# Patient Record
Sex: Female | Born: 1970 | Race: White | Hispanic: No | Marital: Married | Smoking: Never smoker
Health system: Southern US, Community
[De-identification: ages and names within clinical notes are randomized; demographics above are authoritative.]

## PROBLEM LIST (undated history)

## (undated) DIAGNOSIS — B019 Varicella without complication: Secondary | ICD-10-CM

## (undated) DIAGNOSIS — C4491 Basal cell carcinoma of skin, unspecified: Secondary | ICD-10-CM

## (undated) DIAGNOSIS — K219 Gastro-esophageal reflux disease without esophagitis: Secondary | ICD-10-CM

## (undated) DIAGNOSIS — N840 Polyp of corpus uteri: Secondary | ICD-10-CM

## (undated) DIAGNOSIS — H8109 Meniere's disease, unspecified ear: Secondary | ICD-10-CM

## (undated) DIAGNOSIS — N390 Urinary tract infection, site not specified: Secondary | ICD-10-CM

## (undated) HISTORY — DX: Urinary tract infection, site not specified: N39.0

## (undated) HISTORY — DX: Meniere's disease, unspecified ear: H81.09

## (undated) HISTORY — DX: Varicella without complication: B01.9

## (undated) HISTORY — DX: Polyp of corpus uteri: N84.0

## (undated) HISTORY — DX: Gastro-esophageal reflux disease without esophagitis: K21.9

## (undated) HISTORY — PX: DILATION AND CURETTAGE OF UTERUS: SHX78

## (undated) HISTORY — DX: Basal cell carcinoma of skin, unspecified: C44.91

---

## 2010-06-20 ENCOUNTER — Emergency Department: Payer: Self-pay | Admitting: Emergency Medicine

## 2011-01-28 ENCOUNTER — Ambulatory Visit: Payer: Self-pay

## 2011-05-03 ENCOUNTER — Ambulatory Visit: Payer: Self-pay | Admitting: Internal Medicine

## 2012-10-04 ENCOUNTER — Ambulatory Visit: Payer: Self-pay

## 2012-10-14 ENCOUNTER — Ambulatory Visit: Payer: Self-pay | Admitting: Family Medicine

## 2014-03-21 ENCOUNTER — Ambulatory Visit: Payer: Self-pay | Admitting: Nurse Practitioner

## 2014-03-26 ENCOUNTER — Ambulatory Visit: Payer: Self-pay | Admitting: Otolaryngology

## 2014-03-30 ENCOUNTER — Ambulatory Visit: Payer: Self-pay | Admitting: Nurse Practitioner

## 2014-04-06 ENCOUNTER — Ambulatory Visit (INDEPENDENT_AMBULATORY_CARE_PROVIDER_SITE_OTHER): Payer: BLUE CROSS/BLUE SHIELD | Admitting: Nurse Practitioner

## 2014-04-06 ENCOUNTER — Encounter: Payer: Self-pay | Admitting: Nurse Practitioner

## 2014-04-06 ENCOUNTER — Encounter (INDEPENDENT_AMBULATORY_CARE_PROVIDER_SITE_OTHER): Payer: Self-pay

## 2014-04-06 VITALS — BP 106/70 | HR 79 | Temp 98.4°F | Resp 12 | Ht 64.5 in | Wt 198.0 lb

## 2014-04-06 DIAGNOSIS — Z7189 Other specified counseling: Secondary | ICD-10-CM

## 2014-04-06 DIAGNOSIS — Z1239 Encounter for other screening for malignant neoplasm of breast: Secondary | ICD-10-CM

## 2014-04-06 DIAGNOSIS — Z7689 Persons encountering health services in other specified circumstances: Secondary | ICD-10-CM

## 2014-04-06 NOTE — Progress Notes (Signed)
Pre visit review using our clinic review tool, if applicable. No additional management support is needed unless otherwise documented below in the visit note. 

## 2014-04-06 NOTE — Patient Instructions (Signed)
We will contact you about your referral for a mammogram.   Call us if you need [place] in the meantime.   Welcome to [facility]!

## 2014-04-06 NOTE — Progress Notes (Signed)
Subjective:    Patient ID: Holly Davenport, female    DOB: 18-Mar-1970, 44 y.o.   MRN: [id number]  HPI  Holly Davenport is a 44 yo female here to establish care.   1) Health Maintenance-   Diet- Cooks at home, eats out 2 x a week   Exercise- 2 x week, bike, 30 min.   Immunizations- Refused flu  Mammogram- normal in past  Pap- due 2017  Bone Density- Not of age  Colonoscopy- Not of age  [place] Exam- 2015 summer   Dental Exam- Up to date  2) Chronic Problems-  ENT Dr. Pryor Ochoa- Meniere's disease    [place] of thyroid- small nodule not suitable for biopsy at this. Repeat in 6 months.   3) Acute Problems-  None, denies needing lab work- labs checked by another doctor's office recently.    Review of Systems  Constitutional: Negative for fever, chills, diaphoresis, fatigue and unexpected weight change.  HENT: Negative for tinnitus and trouble swallowing.   Eyes: Negative for visual disturbance.       Has Nystagmus- since she was young.   Respiratory: Negative for chest tightness, shortness of breath and wheezing.   Cardiovascular: Negative for chest pain, palpitations and leg swelling.  Gastrointestinal: Negative for nausea, vomiting, abdominal pain, diarrhea, constipation and blood in stool.  Endocrine: Negative for polydipsia, polyphagia and polyuria.  Genitourinary: Negative for dysuria, hematuria, vaginal discharge and vaginal pain.  Musculoskeletal: Negative for myalgias, back pain, arthralgias and gait problem.  Skin: Negative for color change and rash.       Dry skin patches worse in winter.   Neurological: Negative for dizziness, weakness, numbness and headaches.  Hematological: Does not bruise/bleed easily.  Psychiatric/Behavioral: Negative for suicidal ideas and sleep disturbance. The patient is not nervous/anxious.    Past Medical History  Diagnosis Date  . Chicken pox   . UTI (lower urinary tract infection)     Hx of UTI  . GERD (gastroesophageal reflux disease)      History   Social History  . Marital Status: Married    Spouse Name: N/A    Number of Children: N/A  . Years of Education: N/A   Occupational History  . Not on file.   Social History Main Topics  . Smoking status: Never Smoker   . Smokeless tobacco: Never Used  . Alcohol Use: No  . Drug Use: No  . Sexual Activity:    Partners: Male   Other Topics Concern  . 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 No pertinent past surgical history.  Family History  Problem Relation Age of Onset  . Stroke Mother   . Diabetes Father   . Heart disease Father     Heart attack    No Known Allergies  No current outpatient prescriptions on file prior to visit.   No current facility-administered medications on file prior to visit.      Objective:   Physical Exam  Constitutional: She is oriented to person, place, and time. She appears well-developed and well-nourished. No distress.  BP 106/70 mmHg  Pulse 79  Temp(Src) 98.4 F (36.9 C) (Oral)  Resp 12  Ht 5' 4.5" (  1.638 m)  Wt 198 lb (89.812 kg)  BMI 33.47 kg/m2  SpO2 95%  LMP  (Approximate)   HENT:  Head: Normocephalic and atraumatic.  Right Ear: External ear normal.  Left Ear: External ear normal.  Cardiovascular: Normal rate, regular rhythm, normal heart sounds and intact distal pulses.  Exam reveals no gallop and no friction rub.   No murmur heard. Pulmonary/Chest: Effort normal and breath sounds normal. No respiratory distress. She has no wheezes. She has no rales. She exhibits no tenderness.  Neurological: She is alert and oriented to person, place, and time. No cranial nerve deficit. She exhibits normal muscle tone. Coordination normal.  Skin: Skin is warm and dry. No rash noted. She  is not diaphoretic.  Psychiatric: She has a normal mood and affect. Her behavior is normal. Judgment and thought content normal.      Assessment & Plan:

## 2014-04-08 DIAGNOSIS — Z1239 Encounter for other screening for malignant neoplasm of breast: Secondary | ICD-10-CM | POA: Insufficient documentation

## 2014-04-08 NOTE — Assessment & Plan Note (Signed)
Ordered referral for Mammogram. Pt seen by another office and is establishing care here since it is closer. Reviewed were health maintenance, PFSHx, and immunizations. Labs not needed and referrals were placed. FU prn.

## 2014-07-20 ENCOUNTER — Encounter: Payer: BLUE CROSS/BLUE SHIELD | Admitting: Nurse Practitioner

## 2014-08-10 ENCOUNTER — Encounter: Payer: BLUE CROSS/BLUE SHIELD | Admitting: Nurse Practitioner

## 2014-09-25 ENCOUNTER — Other Ambulatory Visit: Payer: Self-pay | Admitting: Otolaryngology

## 2014-09-25 DIAGNOSIS — E041 Nontoxic single thyroid nodule: Secondary | ICD-10-CM

## 2014-12-11 ENCOUNTER — Ambulatory Visit (INDEPENDENT_AMBULATORY_CARE_PROVIDER_SITE_OTHER): Payer: 59 | Admitting: Nurse Practitioner

## 2014-12-11 VITALS — BP 102/80 | HR 75 | Temp 98.1°F | Resp 14 | Ht 64.5 in | Wt 196.4 lb

## 2014-12-11 DIAGNOSIS — R300 Dysuria: Secondary | ICD-10-CM

## 2014-12-11 DIAGNOSIS — Z7251 High risk heterosexual behavior: Secondary | ICD-10-CM

## 2014-12-11 LAB — POCT URINE PREGNANCY: PREG TEST UR: NEGATIVE

## 2014-12-11 LAB — POCT URINALYSIS DIPSTICK
BILIRUBIN UA: NEGATIVE
Glucose, UA: NEGATIVE
Ketones, UA: NEGATIVE
Nitrite, UA: NEGATIVE
Protein, UA: NEGATIVE
Spec Grav, UA: 1.01
Urobilinogen, UA: 0.2
pH, UA: 5.5

## 2014-12-11 MED ORDER — FLUCONAZOLE 150 MG PO TABS: 150.0000 mg | ORAL_TABLET | Freq: Once | ORAL | Status: DC

## 2014-12-11 NOTE — Progress Notes (Signed)
Patient ID: Holly Davenport, female    DOB: [birth year]  Age: 44 y.o. MRN: [id number]  CC: Urinary Tract Infection   HPI Holly Davenport presents for Dysuria x 4 days.  1) Pt describes lower abdominal pain, low back pain, slight nausea, and burning/itching since the weekend.  Last week spotting. Has had unprotected sex with husband and she was hoping it was not pregnancy with the nausea. Burning and itching without discharge or odor.   History Kachina has a past medical history of Chicken pox; UTI (lower urinary tract infection); and GERD (gastroesophageal reflux disease).   She has no past surgical history on file.   Her family history includes Diabetes in her father; Heart disease in her father; Stroke in her mother.She reports that she has never smoked. She has never used smokeless tobacco. She reports that she does not drink alcohol or use illicit drugs.  Outpatient Prescriptions Prior to Visit  Medication Sig Dispense Refill  . triamterene-hydrochlorothiazide (DYAZIDE) 37.5-25 MG per capsule Take 1 capsule by mouth daily.     No facility-administered medications prior to visit.    ROS Review of Systems  Constitutional: Negative for fever, chills, diaphoresis and fatigue.  Respiratory: Negative for chest tightness, shortness of breath and wheezing.   Cardiovascular: Negative for chest pain, palpitations and leg swelling.  Gastrointestinal: Positive for abdominal pain. Negative for nausea, vomiting, diarrhea and abdominal distention.       Lower  Genitourinary: Positive for dysuria and vaginal bleeding. Negative for urgency, frequency and difficulty urinating.       Low back pain    Objective:  BP 102/80 mmHg  Pulse 75  Temp(Src) 98.1 F (36.7 C)  Resp 14  Ht 5' 4.5" (1.638 m)  Wt 196 lb 6.4 oz (89.086 kg)  BMI 33.20 kg/m2  SpO2 97%  Physical Exam  Constitutional: She is oriented to person, place, and time. She appears well-developed and well-nourished. No distress.   HENT:  Head: Normocephalic and atraumatic.  Right Ear: External ear normal.  Left Ear: External ear normal.  Cardiovascular: Normal rate, regular rhythm and normal heart sounds.  Exam reveals no gallop and no friction rub.   No murmur heard. Pulmonary/Chest: Effort normal and breath sounds normal. No respiratory distress. She has no wheezes. She has no rales. She exhibits no tenderness.  Neurological: She is alert and oriented to person, place, and time. No cranial nerve deficit. She exhibits normal muscle tone. Coordination normal.  Skin: Skin is warm and dry. No rash noted. She is not diaphoretic.  Psychiatric: She has a normal mood and affect. Her behavior is normal. Judgment and thought content normal.   Assessment & Plan:   Shatara was seen today for urinary tract infection.  Diagnoses and all orders for this visit:  Dysuria -     POCT Urinalysis Dipstick -     Urine Culture  Unprotected sex -     POCT urine pregnancy  Other orders -     fluconazole (DIFLUCAN) 150 MG tablet; Take 1 tablet (150 mg total) by mouth once.   I am having Ms. Sevey start on fluconazole. I am also having her maintain her triamterene-hydrochlorothiazide.  Meds ordered this encounter  Medications  . fluconazole (DIFLUCAN) 150 MG tablet    Sig: Take 1 tablet (150 mg total) by mouth once.    Dispense:  1 tablet    Refill:  0    Order Specific Question:  Supervising Provider    Answer:  Derrel Nip,  TERESA L [[id number]]     Follow-up: Return if symptoms worsen or fail to improve.

## 2014-12-11 NOTE — Patient Instructions (Signed)
Rest, water, unsweetened cranberry juice- optional Diflucan take 1  We will call with culture results.

## 2014-12-11 NOTE — Progress Notes (Signed)
Pre visit review using our clinic review tool, if applicable. No additional management support is needed unless otherwise documented below in the visit note. 

## 2014-12-12 ENCOUNTER — Encounter: Payer: Self-pay | Admitting: Nurse Practitioner

## 2014-12-12 DIAGNOSIS — R300 Dysuria: Secondary | ICD-10-CM | POA: Insufficient documentation

## 2014-12-12 DIAGNOSIS — Z7251 High risk heterosexual behavior: Secondary | ICD-10-CM | POA: Insufficient documentation

## 2014-12-12 NOTE — Assessment & Plan Note (Signed)
New onset dysuria symptoms. POCT urine was not clear about infection. Will get urine culture. Will treat pt for possible yeast infection with diflucan, but due to recent unprotected sexual intercourse will obtain POCT urine pregnancy before treating

## 2014-12-12 NOTE — Assessment & Plan Note (Signed)
Recent unprotected sex with husband last menstrual period. Will obtain POCT urine pregnancy just to be on the safe side before treatment.

## 2014-12-13 ENCOUNTER — Other Ambulatory Visit: Payer: Self-pay | Admitting: Nurse Practitioner

## 2014-12-13 LAB — URINE CULTURE: Colony Count: >=100000

## 2014-12-13 MED ORDER — FLUCONAZOLE 200 MG PO TABS: 200.0000 mg | ORAL_TABLET | Freq: Every day | ORAL | Status: DC

## 2015-01-06 ENCOUNTER — Encounter: Payer: Self-pay | Admitting: Gynecology

## 2015-01-06 ENCOUNTER — Ambulatory Visit: Payer: 59

## 2015-01-06 ENCOUNTER — Ambulatory Visit
Admission: EM | Admit: 2015-01-06 | Discharge: 2015-01-06 | Disposition: A | Discharge status: Home / Self Care | Payer: 59 | Attending: Family Medicine | Admitting: Family Medicine

## 2015-01-06 DIAGNOSIS — H6593 Unspecified nonsuppurative otitis media, bilateral: Secondary | ICD-10-CM | POA: Diagnosis not present

## 2015-01-06 DIAGNOSIS — J209 Acute bronchitis, unspecified: Secondary | ICD-10-CM | POA: Diagnosis not present

## 2015-01-06 DIAGNOSIS — J0100 Acute maxillary sinusitis, unspecified: Secondary | ICD-10-CM

## 2015-01-06 DIAGNOSIS — J040 Acute laryngitis: Secondary | ICD-10-CM

## 2015-01-06 DIAGNOSIS — J029 Acute pharyngitis, unspecified: Secondary | ICD-10-CM | POA: Diagnosis not present

## 2015-01-06 LAB — RAPID STREP SCREEN (MED CTR MEBANE ONLY): STREPTOCOCCUS, GROUP A SCREEN (DIRECT): NEGATIVE

## 2015-01-06 MED ORDER — DESLORATADINE 5 MG PO TABS: 5.0000 mg | ORAL_TABLET | Freq: Every day | ORAL | Status: DC

## 2015-01-06 MED ORDER — PREDNISONE 20 MG PO TABS: 40.0000 mg | ORAL_TABLET | Freq: Every day | ORAL | Status: DC

## 2015-01-06 MED ORDER — SALINE SPRAY 0.65 % NA SOLN: 2.0000 | NASAL | Status: DC

## 2015-01-06 MED ORDER — FLUTICASONE PROPIONATE 50 MCG/ACT NA SUSP: 1.0000 | Freq: Two times a day (BID) | NASAL | Status: DC

## 2015-01-06 MED ORDER — IPRATROPIUM-ALBUTEROL 0.5-2.5 (3) MG/3ML IN SOLN
3.0000 mL | Freq: Four times a day (QID) | RESPIRATORY_TRACT | Status: DC
  Administered 2015-01-06: 3 mL via RESPIRATORY_TRACT

## 2015-01-06 MED ORDER — ALBUTEROL SULFATE HFA 108 (90 BASE) MCG/ACT IN AERS: 1.0000 | INHALATION_SPRAY | RESPIRATORY_TRACT | Status: DC | PRN

## 2015-01-06 MED ORDER — AMOXICILLIN-POT CLAVULANATE 875-125 MG PO TABS: 1.0000 | ORAL_TABLET | Freq: Two times a day (BID) | ORAL | Status: DC

## 2015-01-06 MED ORDER — BENZONATATE 200 MG PO CAPS: 200.0000 mg | ORAL_CAPSULE | Freq: Three times a day (TID) | ORAL | Status: DC | PRN

## 2015-01-06 NOTE — Discharge Instructions (Signed)
Acute Bronchitis Bronchitis is inflammation of the airways that extend from the windpipe into the lungs (bronchi). The inflammation often causes mucus to develop. This leads to a cough, which is the most common symptom of bronchitis.  In acute bronchitis, the condition usually develops suddenly and goes away over time, usually in a couple weeks. Smoking, allergies, and asthma can make bronchitis worse. Repeated episodes of bronchitis may cause further lung problems.  CAUSES Acute bronchitis is most often caused by the same virus that causes a cold. The virus can spread from person to person (contagious) through coughing, sneezing, and touching contaminated objects. SIGNS AND SYMPTOMS   Cough.   Fever.   Coughing up mucus.   Body aches.   Chest congestion.   Chills.   Shortness of breath.   Sore throat.  DIAGNOSIS  Acute bronchitis is usually diagnosed through a physical exam. Your health care provider will also ask you questions about your medical history. Tests, such as chest X-rays, are sometimes done to rule out other conditions.  TREATMENT  Acute bronchitis usually goes away in a couple weeks. Oftentimes, no medical treatment is necessary. Medicines are sometimes given for relief of fever or cough. Antibiotic medicines are usually not needed but may be prescribed in certain situations. In some cases, an inhaler may be recommended to help reduce shortness of breath and control the cough. A cool mist vaporizer may also be used to help thin bronchial secretions and make it easier to clear the chest.  HOME CARE INSTRUCTIONS  Get plenty of rest.   Drink enough fluids to keep your urine clear or pale yellow (unless you have a medical condition that requires fluid restriction). Increasing fluids may help thin your respiratory secretions (sputum) and reduce chest congestion, and it will prevent dehydration.   Take medicines only as directed by your health care provider.  If  you were prescribed an antibiotic medicine, finish it all even if you start to feel better.  Avoid smoking and secondhand smoke. Exposure to cigarette smoke or irritating chemicals will make bronchitis worse. If you are a smoker, consider using nicotine gum or skin patches to help control withdrawal symptoms. Quitting smoking will help your lungs heal faster.   Reduce the chances of another bout of acute bronchitis by washing your hands frequently, avoiding people with cold symptoms, and trying not to touch your hands to your mouth, nose, or eyes.   Keep all follow-up visits as directed by your health care provider.  SEEK MEDICAL CARE IF: Your symptoms do not improve after 1 week of treatment.  SEEK IMMEDIATE MEDICAL CARE IF:  You develop an increased fever or chills.   You have chest pain.   You have severe shortness of breath.  You have bloody sputum.   You develop dehydration.  You faint or repeatedly feel like you are going to pass out.  You develop repeated vomiting.  You develop a severe headache. MAKE SURE YOU:   Understand these instructions.  Will watch your condition.  Will get help right away if you are not doing well or get worse.   This information is not intended to replace advice given to you by your health care provider. Make sure you discuss any questions you have with your health care provider.   Document Released: 04/02/2004 Document Revised: 03/16/2014 Document Reviewed: 08/16/2012 Elsevier Interactive Patient Education 2016 [employer] Pharyngitis Pharyngitis is redness, pain, and swelling (inflammation) of your pharynx.  CAUSES  Pharyngitis is usually caused by  infection. Most of the time, these infections are from viruses (viral) and are part of a cold. However, sometimes pharyngitis is caused by bacteria (bacterial). Pharyngitis can also be caused by allergies. Viral pharyngitis may be spread from person to person by coughing, sneezing, and  personal items or utensils (cups, forks, spoons, toothbrushes). Bacterial pharyngitis may be spread from person to person by more intimate contact, such as kissing.  SIGNS AND SYMPTOMS  Symptoms of pharyngitis include:   Sore throat.   Tiredness (fatigue).   Low-grade fever.   Headache.  Joint pain and muscle aches.  Skin rashes.  Swollen lymph nodes.  Plaque-like film on throat or tonsils (often seen with bacterial pharyngitis). DIAGNOSIS  Your health care provider will ask you questions about your illness and your symptoms. Your medical history, along with a physical exam, is often all that is needed to diagnose pharyngitis. Sometimes, a rapid strep test is done. Other lab tests may also be done, depending on the suspected cause.  TREATMENT  Viral pharyngitis will usually get better in 3-4 days without the use of medicine. Bacterial pharyngitis is treated with medicines that kill germs (antibiotics).  HOME CARE INSTRUCTIONS   Drink enough water and fluids to keep your urine clear or pale yellow.   Only take over-the-counter or prescription medicines as directed by your health care provider:   If you are prescribed antibiotics, make sure you finish them even if you start to feel better.   Do not take aspirin.   Get lots of rest.   Gargle with 8 oz of salt water ( tsp of salt per 1 qt of water) as often as every 1-2 hours to soothe your throat.   Throat lozenges (if you are not at risk for choking) or sprays may be used to soothe your throat. SEEK MEDICAL CARE IF:   You have large, tender lumps in your neck.  You have a rash.  You cough up green, yellow-brown, or bloody spit. SEEK IMMEDIATE MEDICAL CARE IF:   Your neck becomes stiff.  You drool or are unable to swallow liquids.  You vomit or are unable to keep medicines or liquids down.  You have severe pain that does not go away with the use of recommended medicines.  You have trouble breathing (not  caused by a stuffy nose). MAKE SURE YOU:   Understand these instructions.  Will watch your condition.  Will get help right away if you are not doing well or get worse.   This information is not intended to replace advice given to you by your health care provider. Make sure you discuss any questions you have with your health care provider.   Document Released: 02/23/2005 Document Revised: 12/14/2012 Document Reviewed: 10/31/2012 Elsevier Interactive Patient Education 2016 [employer] Sinusitis, Adult Sinusitis is redness, soreness, and inflammation of the paranasal sinuses. Paranasal sinuses are air pockets within the bones of your face. They are located beneath your eyes, in the middle of your forehead, and above your eyes. In healthy paranasal sinuses, mucus is able to drain out, and air is able to circulate through them by way of your nose. However, when your paranasal sinuses are inflamed, mucus and air can become trapped. This can allow bacteria and other germs to grow and cause infection. Sinusitis can develop quickly and last only a short time (acute) or continue over a long period (chronic). Sinusitis that lasts for more than 12 weeks is considered chronic. CAUSES Causes of sinusitis include:  Allergies.  Structural abnormalities, such as displacement of the cartilage that separates your nostrils (deviated septum), which can decrease the air flow through your nose and sinuses and affect sinus drainage.  Functional abnormalities, such as when the small hairs (cilia) that line your sinuses and help remove mucus do not work properly or are not present. SIGNS AND SYMPTOMS Symptoms of acute and chronic sinusitis are the same. The primary symptoms are pain and pressure around the affected sinuses. Other symptoms include:  Upper toothache.  Earache.  Headache.  Bad breath.  Decreased sense of smell and taste.  A cough, which worsens when you are lying  flat.  Fatigue.  Fever.  Thick drainage from your nose, which often is green and may contain pus (purulent).  Swelling and warmth over the affected sinuses. DIAGNOSIS Your health care provider will perform a physical exam. During your exam, your health care provider may perform any of the following to help determine if you have acute sinusitis or chronic sinusitis:  Look in your nose for signs of abnormal growths in your nostrils (nasal polyps).  Tap over the affected sinus to check for signs of infection.  View the inside of your sinuses using an imaging device that has a light attached (endoscope). If your health care provider suspects that you have chronic sinusitis, one or more of the following tests may be recommended:  Allergy tests.  Nasal culture. A sample of mucus is taken from your nose, sent to a lab, and screened for bacteria.  Nasal cytology. A sample of mucus is taken from your nose and examined by your health care provider to determine if your sinusitis is related to an allergy. TREATMENT Most cases of acute sinusitis are related to a viral infection and will resolve on their own within 10 days. Sometimes, medicines are prescribed to help relieve symptoms of both acute and chronic sinusitis. These may include pain medicines, decongestants, nasal steroid sprays, or saline sprays. However, for sinusitis related to a bacterial infection, your health care provider will prescribe antibiotic medicines. These are medicines that will help kill the bacteria causing the infection. Rarely, sinusitis is caused by a fungal infection. In these cases, your health care provider will prescribe antifungal medicine. For some cases of chronic sinusitis, surgery is needed. Generally, these are cases in which sinusitis recurs more than 3 times per year, despite other treatments. HOME CARE INSTRUCTIONS  Drink plenty of water. Water helps thin the mucus so your sinuses can drain more  easily.  Use a humidifier.  Inhale steam 3-4 times a day (for example, sit in the bathroom with the shower running).  Apply a warm, moist washcloth to your face 3-4 times a day, or as directed by your health care provider.  Use saline nasal sprays to help moisten and clean your sinuses.  Take medicines only as directed by your health care provider.  If you were prescribed either an antibiotic or antifungal medicine, finish it all even if you start to feel better. SEEK IMMEDIATE MEDICAL CARE IF:  You have increasing pain or severe headaches.  You have nausea, vomiting, or drowsiness.  You have swelling around your face.  You have vision problems.  You have a stiff neck.  You have difficulty breathing.   This information is not intended to replace advice given to you by your health care provider. Make sure you discuss any questions you have with your health care provider.   Document Released: 02/23/2005 Document Revised: 03/16/2014 Document Reviewed: 03/10/2011 Elsevier  Interactive Patient Education 2016 [place]. Otitis Media With Effusion Otitis media with effusion is the presence of fluid in the middle ear. This is a common problem in children, which often follows ear infections. It may be present for weeks or longer after the infection. Unlike an acute ear infection, otitis media with effusion refers only to fluid behind the ear drum and not infection. Children with repeated ear and sinus infections and allergy problems are the most likely to get otitis media with effusion. CAUSES  The most frequent cause of the fluid buildup is dysfunction of the eustachian tubes. These are the tubes that drain fluid in the ears to the back of the nose (nasopharynx). SYMPTOMS   The main symptom of this condition is hearing loss. As a result, you or your child may:  Listen to the TV at a loud volume.  Not respond to questions.  Ask "what" often when spoken to.  Mistake or confuse  one sound or word for another.  There may be a sensation of fullness or pressure but usually not pain. DIAGNOSIS   Your health care provider will diagnose this condition by examining you or your child's ears.  Your health care provider may test the pressure in you or your child's ear with a tympanometer.  A hearing test may be conducted if the problem persists. TREATMENT   Treatment depends on the duration and the effects of the effusion.  Antibiotics, decongestants, nose drops, and cortisone-type drugs (tablets or nasal spray) may not be helpful.  Children with persistent ear effusions may have delayed language or behavioral problems. Children at risk for developmental delays in hearing, learning, and speech may require referral to a specialist earlier than children not at risk.  You or your child's health care provider may suggest a referral to an ear, nose, and throat surgeon for treatment. The following may help restore normal hearing:  Drainage of fluid.  Placement of ear tubes (tympanostomy tubes).  Removal of adenoids (adenoidectomy). HOME CARE INSTRUCTIONS   Avoid secondhand smoke.  Infants who are breastfed are less likely to have this condition.  Avoid feeding infants while they are lying flat.  Avoid known environmental allergens.  Avoid people who are sick. SEEK MEDICAL CARE IF:   Hearing is not better in 3 months.  Hearing is worse.  Ear pain.  Drainage from the ear.  Dizziness. MAKE SURE YOU:   Understand these instructions.  Will watch your condition.  Will get help right away if you are not doing well or get worse.   This information is not intended to replace advice given to you by your health care provider. Make sure you discuss any questions you have with your health care provider.   Document Released: 04/02/2004 Document Revised: 03/16/2014 Document Reviewed: 09/20/2012 Elsevier Interactive Patient Education 2016 [employer] Laryngitis Laryngitis is inflammation of your vocal cords. This causes hoarseness, coughing, loss of voice, sore throat, or a dry throat. Your vocal cords are two bands of muscles that are found in your throat. When you speak, these cords come together and vibrate. These vibrations come out through your mouth as sound. When your vocal cords are inflamed, your voice sounds different. Laryngitis can be temporary (acute) or long-term (chronic). Most cases of acute laryngitis improve with time. Chronic laryngitis is laryngitis that lasts for more than three weeks. CAUSES Acute laryngitis may be caused by:  A viral infection.  Lots of talking, yelling, or singing. This is also called vocal  strain.  Bacterial infections. Chronic laryngitis may be caused by:  Vocal strain.  Injury to your vocal cords.  Acid reflux (gastroesophageal reflux disease or GERD).  Allergies.  Sinus infection.  Smoking.  Alcohol abuse.  Breathing in chemicals or dust.  Growths on the vocal cords. RISK FACTORS Risk factors for laryngitis include:  Smoking.  Alcohol abuse.  Having allergies. SIGNS AND SYMPTOMS Symptoms of laryngitis may include:  Low, hoarse voice.  Loss of voice.  Dry cough.  Sore throat.  Stuffy nose. DIAGNOSIS Laryngitis may be diagnosed by:  Physical exam.  Throat culture.  Blood test.  Laryngoscopy. This procedure allows your health care provider to look at your vocal cords with a mirror or viewing tube. TREATMENT Treatment for laryngitis depends on what is causing it. Usually, treatment involves resting your voice and using medicines to soothe your throat. However, if your laryngitis is caused by a bacterial infection, you may need to take antibiotic medicine. If your laryngitis is caused by a growth, you may need to have a procedure to remove it. HOME CARE INSTRUCTIONS  Drink enough fluid to keep your urine clear or pale yellow.  Breathe in moist air. Use  a humidifier if you live in a dry climate.  Take medicines only as directed by your health care provider.  If you were prescribed an antibiotic medicine, finish it all even if you start to feel better.  Do not smoke cigarettes or electronic cigarettes. If you need help quitting, ask your health care provider.  Talk as little as possible. Also avoid whispering, which can cause vocal strain.  Write instead of talking. Do this until your voice is back to normal. SEEK MEDICAL CARE IF:  You have a fever.  You have increasing pain.  You have difficulty swallowing. SEEK IMMEDIATE MEDICAL CARE IF:  You cough up blood.  You have trouble breathing.   This information is not intended to replace advice given to you by your health care provider. Make sure you discuss any questions you have with your health care provider.   Document Released: 02/23/2005 Document Revised: 03/16/2014 Document Reviewed: 08/08/2013 Elsevier Interactive Patient Education [employer].

## 2015-01-06 NOTE — ED Notes (Signed)
Patient c/o coughing / fever at home x 4 days of 101 and lost her voice.

## 2015-01-06 NOTE — ED Provider Notes (Addendum)
CSN: [id number]     Arrival date & time 01/06/15  1227 History   First MD Initiated Contact with Patient 01/06/15 1442     Chief Complaint  Patient presents with  . Cough  . Fever  . Sore Throat   (Consider location/radiation/quality/duration/timing/severity/associated sxs/prior Treatment) HPI Comments: Married caucasian female here for loss of nasal congestion, chest congestion, voice, fever, cough, sore throat tried motrin, sudafed, cough medicine, mucinex D, coricidin without relief of symptoms  +nausea nonproductive cough, chest tight but denied wheezing started 02 Jan 2015 child with similar symptoms has used flonase in past but not now   Patient is a 44 y.o. female presenting with cough, fever, and pharyngitis. The history is provided by the patient.  Cough Associated symptoms: fever   Associated symptoms: no chest pain, no chills, no diaphoresis, no ear pain, no eye discharge, no headaches, no myalgias, no rash, no rhinorrhea, no shortness of breath, no sore throat and no wheezing   Fever Associated symptoms: cough and nausea   Associated symptoms: no chest pain, no chills, no confusion, no congestion, no diarrhea, no dysuria, no ear pain, no headaches, no myalgias, no rash, no rhinorrhea, no sore throat and no vomiting   Sore Throat Pertinent negatives include no chest pain, no abdominal pain, no headaches and no shortness of breath.    Past Medical History  Diagnosis Date  . Chicken pox   . UTI (lower urinary tract infection)     Hx of UTI  . GERD (gastroesophageal reflux disease)    History reviewed. No pertinent past surgical history. Family History  Problem Relation Age of Onset  . Stroke Mother   . Diabetes Father   . Heart disease Father     Heart attack   Social History  Substance Use Topics  . Smoking status: Never Smoker   . Smokeless tobacco: Never Used  . Alcohol Use: No   OB History    No data available     Review of Systems  Constitutional:  Positive for fever. Negative for chills, diaphoresis, activity change, appetite change and fatigue.  HENT: Positive for postnasal drip and voice change. Negative for congestion, dental problem, drooling, ear discharge, ear pain, facial swelling, hearing loss, mouth sores, nosebleeds, rhinorrhea, sinus pressure, sneezing, sore throat, tinnitus and trouble swallowing.   Eyes: Negative for photophobia, pain, discharge, redness, itching and visual disturbance.  Respiratory: Positive for cough and chest tightness. Negative for choking, shortness of breath, wheezing and stridor.   Cardiovascular: Negative for chest pain and leg swelling.  Gastrointestinal: Positive for nausea. Negative for vomiting, abdominal pain, diarrhea, constipation, blood in stool and abdominal distention.  Endocrine: Negative for cold intolerance and heat intolerance.  Genitourinary: Negative for dysuria, hematuria and difficulty urinating.  Musculoskeletal: Negative for myalgias, back pain, joint swelling, arthralgias, gait problem, neck pain and neck stiffness.  Skin: Negative for color change, pallor, rash and wound.  Allergic/Immunologic: Positive for environmental allergies. Negative for food allergies.  Neurological: Negative for dizziness, tremors, seizures, syncope, facial asymmetry, speech difficulty, weakness, light-headedness, numbness and headaches.  Hematological: Negative for adenopathy. Does not bruise/bleed easily.  Psychiatric/Behavioral: Negative for behavioral problems, confusion and agitation. The patient is not nervous/anxious.     Allergies  Review of patient's allergies indicates no known allergies.  Home Medications   Prior to Admission medications   Medication Sig Start Date End Date Taking? Authorizing Provider  triamterene-hydrochlorothiazide (DYAZIDE) 37.5-25 MG per capsule Take 1 capsule by mouth daily.   Yes Historical  Provider, MD  albuterol (PROVENTIL HFA;VENTOLIN HFA) 108 (90 BASE) MCG/ACT  inhaler Inhale 1-2 puffs into the lungs every 4 (four) hours as needed for wheezing or shortness of breath. 01/06/15   Olen Cordial, NP  amoxicillin-clavulanate (AUGMENTIN) 875-125 MG tablet Take 1 tablet by mouth every 12 (twelve) hours. 01/06/15   Olen Cordial, NP  benzonatate (TESSALON) 200 MG capsule Take 1 capsule (200 mg total) by mouth 3 (three) times daily as needed for cough. 01/06/15   Olen Cordial, NP  desloratadine (CLARINEX) 5 MG tablet Take 1 tablet (5 mg total) by mouth daily. 01/06/15   Olen Cordial, NP  fluticasone (FLONASE) 50 MCG/ACT nasal spray Place 1 spray into both nostrils 2 (two) times daily. 01/06/15   Olen Cordial, NP  predniSONE (DELTASONE) 20 MG tablet Take 2 tablets (40 mg total) by mouth daily with breakfast. 01/06/15   Olen Cordial, NP  sodium chloride (OCEAN) 0.65 % SOLN nasal spray Place 2 sprays into both nostrils every 2 (two) hours while awake. 01/06/15   Olen Cordial, NP   Meds Ordered and Administered this Visit   Medications  ipratropium-albuterol (DUONEB) 0.5-2.5 (3) MG/3ML nebulizer solution 3 mL (3 mLs Nebulization Given 01/06/15 1455)    BP 111/76 mmHg  Pulse 95  Temp(Src) 98.7 F (37.1 C) (Oral)  Resp 18  Ht 5\' 8"  (1.727 m)  Wt 190 lb (86.183 kg)  BMI 28.90 kg/m2  SpO2 99%  LMP 12/20/2014 No data found.   Physical Exam  Constitutional: She is oriented to person, place, and time. She appears well-developed and well-nourished. She is active and cooperative.  Non-toxic appearance. She has a sickly appearance. She appears ill. No distress.  HENT:  Head: Normocephalic and atraumatic.  Right Ear: Hearing, external ear and ear canal normal. A middle ear effusion is present.  Left Ear: Hearing, external ear and ear canal normal. A middle ear effusion is present.  Nose: Mucosal edema and rhinorrhea present. No nose lacerations, sinus tenderness, nasal deformity, septal deviation or nasal septal hematoma. No  epistaxis.  No foreign bodies. Right sinus exhibits no maxillary sinus tenderness and no frontal sinus tenderness. Left sinus exhibits no maxillary sinus tenderness and no frontal sinus tenderness.  Mouth/Throat: Uvula is midline and mucous membranes are normal. Mucous membranes are not pale, not dry and not cyanotic. She does not have dentures. No oral lesions. No trismus in the jaw. Normal dentition. No dental abscesses, uvula swelling, lacerations or dental caries. Posterior oropharyngeal edema and posterior oropharyngeal erythema present. No oropharyngeal exudate or tonsillar abscesses.  Cobblestoning posterior pharynx; bilateral TMs with air fluid level slight opacity 6 oclock; bilateral nasal turbinates with edema/erythema clear discharge; hoarse voice/whisper only  Eyes: Conjunctivae, EOM and lids are normal. Pupils are equal, round, and reactive to light. Right eye exhibits no chemosis, no discharge, no exudate and no hordeolum. No foreign body present in the right eye. Left eye exhibits no chemosis, no discharge, no exudate and no hordeolum. No foreign body present in the left eye. Right conjunctiva is not injected. Right conjunctiva has no hemorrhage. Left conjunctiva is not injected. Left conjunctiva has no hemorrhage. No scleral icterus. Right eye exhibits normal extraocular motion and no nystagmus. Left eye exhibits normal extraocular motion and no nystagmus. Right pupil is round and reactive. Left pupil is round and reactive. Pupils are equal.  Neck: Trachea normal and normal range of motion. Neck supple. No tracheal tenderness, no spinous process tenderness and no muscular  tenderness present. No rigidity. No tracheal deviation, no edema, no erythema and normal range of motion present. No thyroid mass and no thyromegaly present.  Cardiovascular: Normal rate, regular rhythm, S1 normal, S2 normal, normal heart sounds and intact distal pulses.  PMI is not displaced.  Exam reveals no gallop and no  friction rub.   No murmur heard. Pulmonary/Chest: Effort normal. No accessory muscle usage or stridor. No respiratory distress. She has decreased breath sounds in the right lower field and the left lower field. She has no wheezes. She has no rhonchi. She has no rales.  Patient unable to phonate to perform egophany  Abdominal: Soft. She exhibits no distension.  Musculoskeletal: Normal range of motion. She exhibits no edema or tenderness.  Lymphadenopathy:       Head (right side): No submental, no submandibular, no tonsillar, no preauricular, no posterior auricular and no occipital adenopathy present.       Head (left side): No submental, no submandibular, no tonsillar, no preauricular, no posterior auricular and no occipital adenopathy present.    She has no cervical adenopathy.       Right cervical: No superficial cervical, no deep cervical and no posterior cervical adenopathy present.      Left cervical: No superficial cervical, no deep cervical and no posterior cervical adenopathy present.  Neurological: She is alert and oriented to person, place, and time. She has normal strength. She exhibits normal muscle tone. Coordination normal.  Skin: Skin is warm, dry and intact. No abrasion, no bruising, no burn, no ecchymosis, no laceration, no lesion, no petechiae and no rash noted. She is not diaphoretic. No cyanosis or erythema. No pallor. Nails show no clubbing.  Psychiatric: She has a normal mood and affect. Her speech is normal and behavior is normal. Judgment and thought content normal. Cognition and memory are normal.  Nursing note and vitals reviewed.   ED Course  Procedures (including critical care time)  Labs Review Labs Reviewed  RAPID STREP SCREEN (NOT AT [facility])  CULTURE, GROUP A STREP [facility] ONLY)    Imaging Review Dg Chest 2 View  01/06/2015  CLINICAL DATA:  Dry cough with fever 5 days. EXAM: CHEST  2 VIEW COMPARISON:  None. FINDINGS: Normal mediastinum and cardiac silhouette.  Normal pulmonary vasculature. No evidence of effusion, infiltrate, or pneumothorax. No acute bony abnormality. IMPRESSION: No acute cardiopulmonary process. Electronically Signed   By: Suzy Bouchard M.D.   On: 01/06/2015 15:33   1525 discussed wet read chest xray negative for pneumonia or fluid in lungs.  Probable bronchitis.  Patient verbalized understanding of information and had no further questions at this time.  MDM   1. Acute bronchitis, unspecified organism   2. Otitis media with effusion, bilateral   3. Acute maxillary sinusitis, recurrence not specified   4. Acute pharyngitis, unspecified etiology   5. Laryngitis, acute    Prednisone 40mg  po daily x 5 days. Albuterol MDI 1-2 puffs po q4-6h prn protracted cough, chest tightness/wheeze.  Tessalon pearles 200mg  po TID prn cough.  Given rx for augmentin 875mg  po BID prn to start if no relief with prednisone and albuterol and if still fever/purulent sputum in 48 hours.  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  Bronchitis simple, community acquired, may have started as viral (probably respiratory syncytial, parainfluenza, influenza, or adenovirus), but now evidence of acute purulent bronchitis with resultant bronchial edema and mucus formation.  Viruses are the most common cause of bronchial inflammation in otherwise healthy  adults with acute bronchitis.  The appearance of sputum is not predictive of whether a bacterial infection is present.  Purulent sputum is most often caused by viral infections.  There are a small portion of those caused by non-viral agents being Mycoplamsa pneumonia.  Microscopic examination or C&S of sputum in the healthy adult with acute bronchitis is generally not helpful (usually negative or normal respiratory flora) other considerations being cough from upper respiratory tract infections, sinusitis or allergic syndromes (mild asthma or viral pneumonia).  Differential Diagnosis:  reactive airway disease (asthma,  allergic aspergillosis (eosinophilia), chronic bronchitis, respiratory infection (Sinusitis, Common cold, pneumonia), congestive heart failure, reflux esophagitis, bronchogenic tumor, aspiration syndromes and/or exposure irritants/tobacco smoke.  In this case, there is no evidence of any invasive bacterial illness.  Most likely viral etiology so will hold on antibiotic treatment.  Advise supportive care with rest, encourage fluids, good hygiene and watch for any worsening symptoms.  If they were to develop:  come back to the office or go to the emergency room if after hours. Without high fever, severe dyspnea, lack of physical findings or other risk factors, I will hold CBC at this time.  I discussed that approximately 50% of patients with acute bronchitis have a cough that lasts up to three weeks, and 25% for over a month.  Tylenol, one to two tablets every four hours as needed for fever or myalgias.   No aspirin.  Patient instructed to follow up in one week or sooner if symptoms worsen. Patient verbalized agreement and understanding of treatment plan.  P2:  hand washing and cover cough  Supportive treatment.   No evidence of invasive bacterial infection, non toxic and well hydrated.  This is most likely self limiting viral infection.  I do not see where any further testing or imaging is necessary at this time.   I will suggest supportive care, rest, good hygiene and encourage the patient to take adequate fluids.  The patient is to return to clinic or EMERGENCY ROOM if symptoms worsen or change significantly e.g. ear pain, fever, purulent discharge from ears or bleeding.  Exitcare handout on otitis media with effusion given to patient.  Patient verbalized agreement and understanding of treatment plan.    clarinex 5mg  po daily; flonase 1 spray each nostril BID and nasal saline 2 sprays each nostril q2h while awake.  Tylenol 1000mg  po QID prn pain/fever.  If no relief 48 hours with this regimen start augmentin  875mg  po BID x 10 days.  No evidence of systemic bacterial infection, non toxic and well hydrated.  I do not see where any further testing or imaging is necessary at this time.   I will suggest supportive care, rest, good hygiene and encourage the patient to take adequate fluids.  The patient is to return to clinic or EMERGENCY ROOM if symptoms worsen or change significantly.  Exitcare handout on sinusitis given to patient.  Patient verbalized agreement and understanding of treatment plan and had no further questions at this time.   P2:  Hand washing and cover cough  Rapid strep negative discussed with patient.  Will call with throat culture results once available typically 48 hours.  Has Rx for augmentin 875mg  po BID x 10 days.  PND from sinusitis suspect cause of sore throat.  School/work excuse note given to patient for 24 hours.  Usually no specific medical treatment is needed if a virus is causing the sore throat.  The throat most often gets better on its  own within 5 to 7 days.  Antibiotic medicine does not cure viral pharyngitis.   For acute pharyngitis caused by bacteria, your healthcare provider will prescribe an antibiotic.  Marland Kitchen Do not smoke.  Marland Kitchen Avoid secondhand smoke and other air pollutants.  . Use a cool mist humidifier to add moisture to the air.  . Get plenty of rest.  . You may want to rest your throat by talking less and eating a diet that is mostly liquid or soft for a day or two.   Marland Kitchen Nonprescription throat lozenges and mouthwashes should help relieve the soreness.   . Gargling with warm saltwater and drinking warm liquids may help.  (You can make a saltwater solution by adding 1/4 teaspoon of salt to 8 ounces, or 240 mL, of warm water.)  . A nonprescription pain reliever such as aspirin, acetaminophen, or ibuprofen may ease general aches and pains.   FOLLOW UP with clinic provider if no improvements in the next 7-10 days.  Patient verbalized understanding of instructions and agreed with  plan of care. P2:  Hand washing and diet.  Olen Cordial, NP 01/06/15 1801  11 Jan 2015 at [place] contacted patient via telephone sinus congestion improved, voice back, fever broke Sunday night 30 Oct did not start augmentin using flonase and prednisone and saline.  Discussed throat culture normal/negative with patient.  Patient to continue plan of care as previously discussed.  Patient verbalized understanding of information/instructions, agreed with plan of care and had no further questions at this time.  Olen Cordial, NP 01/11/15 [phone]

## 2015-01-08 LAB — CULTURE, GROUP A STREP (THRC)

## 2015-01-10 NOTE — ED Notes (Signed)
Final report of strep testing negative  

## 2015-03-28 ENCOUNTER — Ambulatory Visit: Payer: BLUE CROSS/BLUE SHIELD

## 2015-04-03 ENCOUNTER — Other Ambulatory Visit: Payer: Self-pay | Admitting: Otolaryngology

## 2015-04-03 DIAGNOSIS — E041 Nontoxic single thyroid nodule: Secondary | ICD-10-CM

## 2015-04-08 ENCOUNTER — Ambulatory Visit
Admission: RE | Admit: 2015-04-08 | Discharge: 2015-04-08 | Disposition: A | Discharge status: Home / Self Care | Payer: BLUE CROSS/BLUE SHIELD | Source: Ambulatory Visit | Attending: Otolaryngology | Admitting: Otolaryngology

## 2015-04-08 DIAGNOSIS — E041 Nontoxic single thyroid nodule: Secondary | ICD-10-CM

## 2016-07-28 ENCOUNTER — Telehealth: Payer: Self-pay | Admitting: Family

## 2016-07-28 NOTE — Telephone Encounter (Signed)
Left voice mail to call back 

## 2016-07-28 NOTE — Telephone Encounter (Signed)
Please call this patient, thanks :) 

## 2016-07-28 NOTE — Telephone Encounter (Signed)
Call pt  We were cc'ed on lab work from ENT.   Vitamin d is low  If plans to follow with me, she needs to make an appt to establish care

## 2016-07-31 NOTE — Telephone Encounter (Signed)
Patient advised of below and verbalized understanding.  Appointment to see you on 08/18/16

## 2016-08-18 ENCOUNTER — Encounter (INDEPENDENT_AMBULATORY_CARE_PROVIDER_SITE_OTHER): Payer: Self-pay

## 2016-08-18 ENCOUNTER — Ambulatory Visit (INDEPENDENT_AMBULATORY_CARE_PROVIDER_SITE_OTHER): Payer: BLUE CROSS/BLUE SHIELD | Admitting: Family

## 2016-08-18 ENCOUNTER — Encounter: Payer: Self-pay | Admitting: Family

## 2016-08-18 VITALS — BP 108/80 | HR 85 | Temp 98.1°F | Ht 66.25 in | Wt 198.0 lb

## 2016-08-18 DIAGNOSIS — H8109 Meniere's disease, unspecified ear: Secondary | ICD-10-CM | POA: Diagnosis not present

## 2016-08-18 DIAGNOSIS — Z1231 Encounter for screening mammogram for malignant neoplasm of breast: Secondary | ICD-10-CM

## 2016-08-18 DIAGNOSIS — E559 Vitamin D deficiency, unspecified: Secondary | ICD-10-CM | POA: Diagnosis not present

## 2016-08-18 DIAGNOSIS — Z1239 Encounter for other screening for malignant neoplasm of breast: Secondary | ICD-10-CM

## 2016-08-18 NOTE — Progress Notes (Signed)
Subjective:    Patient ID: Holly Davenport, female    DOB: 1970/10/22, 46 y.o.   MRN: [id number]  CC: Holly Davenport is a 46 y.o. female who presents today for follow up.   HPI: Here today as vitamin d had been low at [employer] office.   Doesn't take a multivitamin. Had been tired in winter time but since resolved with more 'sunshine'  Meniere's diease- takes hctz; follows with [facility]. 'Very stable.' 3-4 yrs ago diagnosed when was having vertigo, off balance, since resolved. Electrolytes normal and checked 07/2016 with ENT.           HISTORY:  Past Medical History:  Diagnosis Date  . Chicken pox   . GERD (gastroesophageal reflux disease)   . UTI (lower urinary tract infection)    Hx of UTI   No past surgical history on file. Family History  Problem Relation Age of Onset  . Stroke Mother   . Diabetes Father   . Heart disease Father        Heart attack    Allergies: Patient has no known allergies. Current Outpatient Prescriptions on File Prior to Visit  Medication Sig Dispense Refill  . triamterene-hydrochlorothiazide (DYAZIDE) 37.5-25 MG per capsule Take 1 capsule by mouth daily.     No current facility-administered medications on file prior to visit.     Social History  Substance Use Topics  . Smoking status: Never Smoker  . Smokeless tobacco: Never Used  . Alcohol use No    Review of Systems  Constitutional: Negative for chills and fever.  HENT: Negative for hearing loss.   Respiratory: Negative for cough.   Cardiovascular: Negative for chest pain and palpitations.  Gastrointestinal: Negative for nausea and vomiting.  Neurological: Negative for dizziness and headaches.      Objective:    BP 108/80   Pulse 85   Temp 98.1 F (36.7 C) (Oral)   Ht 5' 6.25" (1.683 m)   Wt 198 lb (89.8 kg)   LMP 08/02/2016 (Exact Date)   SpO2 97%   BMI 31.72 kg/m  BP Readings from Last 3 Encounters:  08/18/16 108/80  01/06/15 111/76  12/11/14 102/80   Wt  Readings from Last 3 Encounters:  08/18/16 198 lb (89.8 kg)  01/06/15 190 lb (86.2 kg)  12/11/14 196 lb 6.4 oz (89.1 kg)    Physical Exam  Constitutional: She appears well-developed and well-nourished.  Eyes: Conjunctivae are normal.  Cardiovascular: Normal rate, regular rhythm, normal heart sounds and normal pulses.   Pulmonary/Chest: Effort normal and breath sounds normal. She has no wheezes. She has no rhonchi. She has no rales.  Neurological: She is alert.  Skin: Skin is warm and dry.  Psychiatric: She has a normal mood and affect. Her speech is normal and behavior is normal. Thought content normal.  Vitals reviewed.      Assessment & Plan:   Problem List Items Addressed This Visit      Nervous and Auditory   Meniere's disease    Stable. Follows with ent.        Other   Screening for breast cancer    Ordered. Patient will schedule      Relevant Orders   MM SCREENING BREAST TOMO BILATERAL   Vitamin D deficiency - Primary    Reviewed labs with patient. Advise 800mg  vitamin d.           I have discontinued [name]'s fluticasone, sodium chloride, benzonatate, albuterol, amoxicillin-clavulanate, predniSONE, and desloratadine.  I am also having her maintain her triamterene-hydrochlorothiazide.   No orders of the defined types were placed in this encounter.   Return precautions given.   Risks, benefits, and alternatives of the medications and treatment plan prescribed today were discussed, and patient expressed understanding.   Education regarding symptom management and diagnosis given to patient on AVS.  Continue to follow with Burnard Hawthorne, FNP for routine health maintenance.   Holly Bouillon Camilli and I agreed with plan.   Holly Paris, FNP

## 2016-08-18 NOTE — Patient Instructions (Addendum)
3D mammogram.   Vitamin D is slightly low. Please start cholecalciferol 800 units daily for next 3 to 4 months. You may find this over the counter in the drug store. Please call to make an appointment for 3 to 4 months out so we can recheck. Also, please ensure you are following a diet high in calcium -- research shows better outcomes with dietary sources including kale, yogurt, broccolii, cheese, okra, almonds- to name a few.    We placed a referral. Mammogram this year. I asked that you call one the below locations and schedule this when it is convenient for you.   If you have dense breasts, you may ask for 3D mammogram over the traditional 2D mammogram as new evidence suggest 3D is superior. Please note that NOT all insurance companies cover 3D and you may have to pay a higher copay. You may call your insurance company to further clarify your benefits.   Options for [place]  [place], [place]  * [facility] [place], [place] * Note if you ask for 3D mammogram at this location, you must request [place], [place] location*

## 2016-08-18 NOTE — Assessment & Plan Note (Signed)
Ordered.  Patient will schedule. 

## 2016-08-18 NOTE — Assessment & Plan Note (Signed)
Stable. Follows with ent.

## 2016-08-18 NOTE — Assessment & Plan Note (Signed)
Reviewed labs with patient. Advise 800mg  vitamin d.

## 2016-08-18 NOTE — Progress Notes (Signed)
Pre visit review using our clinic review tool, if applicable. No additional management support is needed unless otherwise documented below in the visit note. 

## 2016-09-01 ENCOUNTER — Ambulatory Visit
Admission: RE | Admit: 2016-09-01 | Discharge: 2016-09-01 | Disposition: A | Discharge status: Home / Self Care | Payer: BLUE CROSS/BLUE SHIELD | Source: Ambulatory Visit | Attending: Family | Admitting: Family

## 2016-09-01 DIAGNOSIS — Z1231 Encounter for screening mammogram for malignant neoplasm of breast: Secondary | ICD-10-CM | POA: Diagnosis present

## 2016-09-01 DIAGNOSIS — Z1239 Encounter for other screening for malignant neoplasm of breast: Secondary | ICD-10-CM

## 2017-02-01 ENCOUNTER — Telehealth: Payer: Self-pay | Admitting: Family

## 2017-02-01 NOTE — Telephone Encounter (Signed)
Not sure who this should go to?

## 2017-02-01 NOTE — Telephone Encounter (Signed)
Copied from [place] [phone]. Topic: Quick Communication - See Telephone Encounter >> Feb 01, 2017 12:38 PM Cleaster Corin, NT wrote: CRM for notification. See Telephone encounter for:   02/01/17. Pt. Calling received a bill in mail for an appointment on 08/18/16 which was a physical but it was charted as an office visit. Pt. Stated she has been getting the run around and needs an answer asap because bill is going to collections. Please give pt. A call after 3pm [phone]

## 2017-02-02 NOTE — Telephone Encounter (Signed)
I spoke with Ms. Lenetta Quaker . [facility] will send her information to billing.

## 2017-03-17 ENCOUNTER — Ambulatory Visit (INDEPENDENT_AMBULATORY_CARE_PROVIDER_SITE_OTHER): Payer: BLUE CROSS/BLUE SHIELD

## 2017-03-17 ENCOUNTER — Encounter: Payer: Self-pay | Admitting: Podiatry

## 2017-03-17 ENCOUNTER — Ambulatory Visit (INDEPENDENT_AMBULATORY_CARE_PROVIDER_SITE_OTHER): Payer: BLUE CROSS/BLUE SHIELD | Admitting: Podiatry

## 2017-03-17 VITALS — BP 108/73 | HR 73 | Resp 16

## 2017-03-17 DIAGNOSIS — M722 Plantar fascial fibromatosis: Secondary | ICD-10-CM

## 2017-03-17 MED ORDER — DICLOFENAC SODIUM 75 MG PO TBEC: 75.0000 mg | DELAYED_RELEASE_TABLET | Freq: Two times a day (BID) | ORAL | 2 refills | Status: DC

## 2017-03-17 MED ORDER — TRIAMCINOLONE ACETONIDE 10 MG/ML IJ SUSP
10.0000 mg | Freq: Once | INTRAMUSCULAR | Status: AC
  Administered 2017-03-17: 10 mg

## 2017-03-17 NOTE — Patient Instructions (Signed)

## 2017-03-17 NOTE — Progress Notes (Signed)
   Subjective:    Patient ID: Holly Davenport, female    DOB: 1970/08/29, 47 y.o.   MRN: [id number]  HPI    Review of Systems  All other systems reviewed and are negative.      Objective:   Physical Exam        Assessment & Plan:

## 2017-03-17 NOTE — Progress Notes (Signed)
Subjective:   Patient ID: Holly Davenport, female   DOB: 47 y.o.   MRN: [id number]   HPI Patient presents stating my heel is bothering me and has been getting gradually worse for the last 3 months patient states that she is tried different treatment options of padding and shoe gear modifications without relief of her symptoms   Review of Systems  All other systems reviewed and are negative.       Objective:  Physical Exam  Constitutional: She appears well-developed and well-nourished.  Cardiovascular: Intact distal pulses.  Pulmonary/Chest: Effort normal.  Musculoskeletal: Normal range of motion.  Neurological: She is alert.  Skin: Skin is warm.  Nursing note and vitals reviewed.   Neurovascular status intact muscle strength adequate range of motion within normal limits with patient found to have exquisite discomfort plantar aspect right heel at the insertional point of the tendon calcaneus with fluid buildup around the medial band.  Patient has good digital perfusion well oriented x3 and at this time does not smoke and likes to be active     Assessment:  Acute plantar fasciitis right with inflammation fluid of the medial band     Plan:  H&P condition reviewed and went ahead today and injected the plantar fascia right 3 mg Kenalog 5 mg Xylocaine and applied fascial brace.  Gave instructions on physical therapy and patient will be seen back for [place] to recheck.  X-rays indicate there is a small spur with no indication of stress fracture or advanced arthritis

## 2017-03-31 ENCOUNTER — Ambulatory Visit (INDEPENDENT_AMBULATORY_CARE_PROVIDER_SITE_OTHER): Payer: BLUE CROSS/BLUE SHIELD | Admitting: Podiatry

## 2017-03-31 ENCOUNTER — Encounter: Payer: Self-pay | Admitting: Podiatry

## 2017-03-31 DIAGNOSIS — M722 Plantar fascial fibromatosis: Secondary | ICD-10-CM | POA: Diagnosis not present

## 2017-04-03 NOTE — Progress Notes (Signed)
Subjective:   Patient ID: Holly Davenport, female   DOB: 47 y.o.   MRN: [id number]   HPI Patient states she is feeling a lot better with diminishment of discomfort in the heel with pain still noted upon deep palpation but improved   ROS      Objective:  Physical Exam  Neurovascular status intact with discomfort in the plantar heel which is improved quite a bit with pain still upon deep palpation     Assessment:  Inflammatory fasciitis that improved with routine care     Plan:  Satisfied with condition and have advised on supportive shoe gear anti-inflammatories and patient will be seen back to recheck

## 2017-07-05 ENCOUNTER — Ambulatory Visit (INDEPENDENT_AMBULATORY_CARE_PROVIDER_SITE_OTHER): Payer: BLUE CROSS/BLUE SHIELD | Admitting: Podiatry

## 2017-07-05 ENCOUNTER — Encounter: Payer: Self-pay | Admitting: Podiatry

## 2017-07-05 DIAGNOSIS — M722 Plantar fascial fibromatosis: Secondary | ICD-10-CM

## 2017-07-05 MED ORDER — TRIAMCINOLONE ACETONIDE 10 MG/ML IJ SUSP
10.0000 mg | Freq: Once | INTRAMUSCULAR | Status: AC
  Administered 2017-07-05: 10 mg

## 2017-07-05 NOTE — Progress Notes (Signed)
Subjective:   Patient ID: Holly Davenport, female   DOB: 47 y.o.   MRN: [id number]   HPI Patient presents stating he has had a flareup of the plantar heel right and has gotten quite a bit more active recently   ROS      Objective:  Physical Exam  Exquisite discomfort plantar fascial right at the insertional point of the tendon into the calcaneus     Assessment:  Acute plantar fasciitis right     Plan:  Working to try medication again see the response and today injected the plantar fascia right 3 mg Kenalog 5 mg Xylocaine and instructed on physical therapy.  Reappoint to recheck and may require more aggressive treatment depending on response

## 2017-07-07 ENCOUNTER — Telehealth: Payer: Self-pay | Admitting: Podiatry

## 2017-07-07 NOTE — Telephone Encounter (Signed)
I came in and saw Dr. Paulla Dolly on Monday. I'm still having quite a bit of pain and I just wanted to talk to someone. If you would give me a call back at [phone]. Thank you.

## 2017-07-07 NOTE — Telephone Encounter (Signed)
Pt states the previous shot helped the same day, but this time the shot has not helped at all, and the pain is so bad she had to come home from work. I told pt that on occasion a steroid shot can cause a flare of symptoms, that she should ice the area 3-4 times daily for 15-20 minutes/sessions and continue the antiinflammatories, and make an appt to come in 10 - 14 days. Transferred pt to [facility].

## 2017-07-15 ENCOUNTER — Encounter: Payer: Self-pay | Admitting: Podiatry

## 2017-07-15 ENCOUNTER — Ambulatory Visit (INDEPENDENT_AMBULATORY_CARE_PROVIDER_SITE_OTHER): Payer: BLUE CROSS/BLUE SHIELD | Admitting: Podiatry

## 2017-07-15 DIAGNOSIS — M722 Plantar fascial fibromatosis: Secondary | ICD-10-CM | POA: Diagnosis not present

## 2017-07-15 MED ORDER — TRIAMCINOLONE ACETONIDE 10 MG/ML IJ SUSP
10.0000 mg | Freq: Once | INTRAMUSCULAR | Status: AC
  Administered 2017-07-15: 10 mg

## 2017-07-15 NOTE — Progress Notes (Signed)
Subjective:   Patient ID: Holly Davenport, female   DOB: 47 y.o.   MRN: [id number]   HPI Patient presents with severe discomfort plantar aspect right heel and states the last injection did not help her she is having trouble bearing any weight down on her heel   ROS      Objective:  Physical Exam  Neurovascular status intact with exquisite discomfort plantar to the more proximal portion of the heel with inflammation fluid and pain with any form of activity     Assessment:  Acute plantar fasciitis right heel with involvement of the central mid band     Plan:  H&P condition reviewed and careful injection administered and applied air fracture walker to completely immobilize the plantar heel and take all stress off of it.  Patient will be seen back for [place] to recheck again in the next 3 weeks

## 2017-08-09 ENCOUNTER — Encounter: Payer: Self-pay | Admitting: Podiatry

## 2017-08-09 ENCOUNTER — Ambulatory Visit (INDEPENDENT_AMBULATORY_CARE_PROVIDER_SITE_OTHER): Payer: BLUE CROSS/BLUE SHIELD | Admitting: Podiatry

## 2017-08-09 DIAGNOSIS — M722 Plantar fascial fibromatosis: Secondary | ICD-10-CM | POA: Diagnosis not present

## 2017-08-09 NOTE — Progress Notes (Signed)
Subjective:   Patient ID: Holly Davenport, female   DOB: 47 y.o.   MRN: [id number]   HPI Patient states the right heel has improved quite a bit with pain still noted upon deep palpation but overall quite a bit better than it was preoperatively   ROS      Objective:  Physical Exam  Upon palpation significant diminishment of discomfort to the plantar aspect of the right heel at the insertional point tendon calcaneus     Assessment:  Inflammatory fasciitis right improving     Plan:  Reviewed condition recommended anti-inflammatories supportive therapy and at this time went ahead and advised this patient on supportive shoe gear changes.  Patient will be seen back as needed

## 2017-11-23 ENCOUNTER — Other Ambulatory Visit: Payer: Self-pay

## 2017-11-23 ENCOUNTER — Other Ambulatory Visit: Payer: Self-pay | Admitting: Unknown Physician Specialty

## 2017-11-23 DIAGNOSIS — Z1231 Encounter for screening mammogram for malignant neoplasm of breast: Secondary | ICD-10-CM

## 2017-11-29 ENCOUNTER — Ambulatory Visit
Admission: RE | Admit: 2017-11-29 | Discharge: 2017-11-29 | Disposition: A | Discharge status: Home / Self Care | Payer: BLUE CROSS/BLUE SHIELD | Source: Ambulatory Visit | Attending: Unknown Physician Specialty | Admitting: Unknown Physician Specialty

## 2017-11-29 ENCOUNTER — Encounter (INDEPENDENT_AMBULATORY_CARE_PROVIDER_SITE_OTHER): Payer: Self-pay

## 2017-11-29 DIAGNOSIS — Z1231 Encounter for screening mammogram for malignant neoplasm of breast: Secondary | ICD-10-CM | POA: Diagnosis not present

## 2018-07-17 DIAGNOSIS — L570 Actinic keratosis: Secondary | ICD-10-CM

## 2018-07-17 HISTORY — DX: Actinic keratosis: L57.0

## 2020-03-20 ENCOUNTER — Other Ambulatory Visit: Payer: Self-pay | Admitting: Family Medicine

## 2020-03-24 ENCOUNTER — Other Ambulatory Visit: Payer: Self-pay | Admitting: Family Medicine

## 2020-03-24 DIAGNOSIS — N6459 Other signs and symptoms in breast: Secondary | ICD-10-CM

## 2020-03-24 DIAGNOSIS — Z1231 Encounter for screening mammogram for malignant neoplasm of breast: Secondary | ICD-10-CM

## 2020-03-26 ENCOUNTER — Other Ambulatory Visit: Payer: Self-pay

## 2020-03-26 ENCOUNTER — Encounter: Payer: Self-pay | Admitting: Dermatology

## 2020-03-26 ENCOUNTER — Ambulatory Visit (INDEPENDENT_AMBULATORY_CARE_PROVIDER_SITE_OTHER): Payer: BC Managed Care – PPO | Admitting: Dermatology

## 2020-03-26 DIAGNOSIS — L820 Inflamed seborrheic keratosis: Secondary | ICD-10-CM | POA: Diagnosis not present

## 2020-03-26 DIAGNOSIS — L570 Actinic keratosis: Secondary | ICD-10-CM | POA: Diagnosis not present

## 2020-03-26 DIAGNOSIS — C44319 Basal cell carcinoma of skin of other parts of face: Secondary | ICD-10-CM

## 2020-03-26 DIAGNOSIS — L300 Nummular dermatitis: Secondary | ICD-10-CM | POA: Diagnosis not present

## 2020-03-26 DIAGNOSIS — C4441 Basal cell carcinoma of skin of scalp and neck: Secondary | ICD-10-CM | POA: Diagnosis not present

## 2020-03-26 DIAGNOSIS — C4491 Basal cell carcinoma of skin, unspecified: Secondary | ICD-10-CM

## 2020-03-26 DIAGNOSIS — D485 Neoplasm of uncertain behavior of skin: Secondary | ICD-10-CM

## 2020-03-26 HISTORY — DX: Basal cell carcinoma of skin, unspecified: C44.91

## 2020-03-26 MED ORDER — MOMETASONE FUROATE 0.1 % EX CREA: TOPICAL_CREAM | CUTANEOUS | 1 refills | Status: DC

## 2020-03-26 NOTE — Progress Notes (Signed)
Follow-Up Visit   Subjective  Holly Davenport is a 50 y.o. female who presents for the following: Check spots (Patient has a history of spots on her arms/hands, neck that start out itchy and inflamed, and she scratches off. She has a prescription cream that she has been using on these areas, unsure of name, and also uses CeraVe Cream. She also has a red spot on her left shoulder, which has improved since she made the appointment. She has a history of Aks. She has a history of skin cancer of the scalp 5-6 years ago and had MOHS surgery done in [place]. ).   The following portions of the chart were reviewed this encounter and updated as appropriate:       Review of Systems:  No other skin or systemic complaints except as noted in HPI or Assessment and Plan.  Objective  Well appearing patient in no apparent distress; mood and affect are within normal limits.  A focused examination was performed including face, neck, chest, arms. Relevant physical exam findings are noted in the Assessment and Plan.  Objective  Left Anterior Neck: 1.5cm pink macule with focal crusting  R/o bcc vs scc is     Objective  Left lateral neck: 2x1.5cm pink scaly patch Ak vs isk r/o scc is     Objective  Left Upper Temple: Upper 7.42mm pink slightly pearly macule Ak r/o BCC     Objective  L forehead x 1, L medial eyebrow x 1, R forearm x 3, R hand dorsum x 2, L hand dorsum x 2, L forearm x 2, L inf clavicle x 1 (12): Erythematous thin papules/macules with gritty scale.   Objective  Left Shoulder - Posterior: Waxy keratotic papule, residual mild erythema  Objective  Arms: Scaly erythematous papules and patches   Assessment & Plan  Neoplasm of uncertain behavior of skin (3) Left Anterior Neck  Skin / nail biopsy Type of biopsy: tangential   Informed consent: discussed and consent obtained   Patient was prepped and draped in usual sterile fashion: Area prepped with  alcohol. Anesthesia: the lesion was anesthetized in a standard fashion   Anesthetic:  1% lidocaine w/ epinephrine 1-100,000 buffered w/ 8.4% NaHCO3 Instrument used: flexible razor blade   Hemostasis achieved with: pressure, aluminum chloride and electrodesiccation   Outcome: patient tolerated procedure well   Post-procedure details: wound care instructions given   Post-procedure details comment:  Ointment and small bandage applied  Specimen 1 - Surgical pathology Differential Diagnosis: r/o BCC vs SCC IS Check Margins: No 1.5cm pink macule with focal crusting  Left lateral neck  Skin / nail biopsy Type of biopsy: tangential   Informed consent: discussed and consent obtained   Patient was prepped and draped in usual sterile fashion: Area prepped with alcohol. Anesthesia: the lesion was anesthetized in a standard fashion   Anesthetic:  1% lidocaine w/ epinephrine 1-100,000 buffered w/ 8.4% NaHCO3 Instrument used: flexible razor blade   Hemostasis achieved with: pressure, aluminum chloride and electrodesiccation   Outcome: patient tolerated procedure well   Post-procedure details: wound care instructions given   Post-procedure details comment:  Ointment and small bandage applied  Specimen 2 - Surgical pathology Differential Diagnosis: AK vs ISK r/o SCC IS Check Margins: No 2x1.5cm pink scaly patch   Left Upper Temple  Skin / nail biopsy Type of biopsy: tangential   Informed consent: discussed and consent obtained   Patient was prepped and draped in usual sterile fashion: Area prepped  with alcohol. Anesthesia: the lesion was anesthetized in a standard fashion   Anesthetic:  1% lidocaine w/ epinephrine 1-100,000 buffered w/ 8.4% NaHCO3 Instrument used: flexible razor blade   Hemostasis achieved with: pressure, aluminum chloride and electrodesiccation   Outcome: patient tolerated procedure well   Post-procedure details: wound care instructions given   Post-procedure details  comment:  Ointment and small bandage applied  Specimen 3 - Surgical pathology Differential Diagnosis: AK r/o BCC Check Margins: No 7.44mm pink slightly pearly macule   Biopsy x 3 performed. If positive, discussed EDC vs topical vs EXC   AK (actinic keratosis) (12) L forehead x 1, L medial eyebrow x 1, R forearm x 3, R hand dorsum x 2, L hand dorsum x 2, L forearm x 2, L inf clavicle x 1  Destruction of lesion - L forehead x 1, L medial eyebrow x 1, R forearm x 3, R hand dorsum x 2, L hand dorsum x 2, L forearm x 2, L inf clavicle x 1  Destruction method: cryotherapy   Informed consent: discussed and consent obtained   Lesion destroyed using liquid nitrogen: Yes   Region frozen until ice ball extended beyond lesion: Yes   Outcome: patient tolerated procedure well with no complications   Post-procedure details: wound care instructions given    Inflamed seborrheic keratosis Left Shoulder - Posterior  Resolving.  Benign, observe.    Nummular dermatitis Arms  Recommend mild soap and moisturizing cream 1-2 times daily.   Start mometasone cream Apply qd/bid to AAs rash on arms prn   mometasone (ELOCON) 0.1 % cream - Arms  Return in about 4 months (around 07/24/2020) for TBSE, recheck AKs, bxs.Lindi Adie, CMA, am acting as scribe for Brendolyn Patty, MD .  Documentation: I have reviewed the above documentation for accuracy and completeness, and I agree with the above.  Brendolyn Patty MD

## 2020-03-26 NOTE — Patient Instructions (Addendum)
Wound Care Instructions  1. Cleanse wound gently with soap and water once a day then pat dry with clean gauze. Apply a thing coat of Petrolatum (petroleum jelly, "Vaseline") over the wound (unless you have an allergy to this). We recommend that you use a new, sterile tube of Vaseline. Do not pick or remove scabs. Do not remove the yellow or white "healing tissue" from the base of the wound.  2. Cover the wound with fresh, clean, nonstick gauze and secure with paper tape. You may use Band-Aids in place of gauze and tape if the would is small enough, but would recommend trimming much of the tape off as there is often too much. Sometimes Band-Aids can irritate the skin.  3. You should call the office for your biopsy report after 1 week if you have not already been contacted.  4. If you experience any problems, such as abnormal amounts of bleeding, swelling, significant bruising, significant pain, or evidence of infection, please call the office immediately.   Cryotherapy Aftercare  . Wash gently with soap and water everyday.   Marland Kitchen Apply Vaseline and Band-Aid daily until healed.    Mometasone cream - Apply to rash on arms 1-2 times a day until improved.   Recommend mild soap and moisturizing cream 1-2 times daily.   Topical steroids (such as triamcinolone, fluocinolone, fluocinonide, mometasone, clobetasol, halobetasol, betamethasone, hydrocortisone) can cause thinning and lightening of the skin if they are used for too long in the same area. Your physician has selected the right strength medicine for your problem and area affected on the body. Please use your medication only as directed by your physician to prevent side effects.

## 2020-04-02 ENCOUNTER — Telehealth: Payer: Self-pay

## 2020-04-02 NOTE — Telephone Encounter (Signed)
Advised pt of bx results and scheduled pt for EDC x 3 on 05/06/20 at 4:30./sh

## 2020-04-02 NOTE — Telephone Encounter (Signed)
-----   Message from Brendolyn Patty, MD sent at 04/01/2020  9:43 AM EST ----- 1. Skin , left anterior neck BASAL CELL CARCINOMA, SUPERFICIAL AND NODULAR PATTERNS 2. Skin , left lateral neck BASAL CELL CARCINOMA, SUPERFICIAL AND NODULAR PATTERNS 3. Skin , left upper temple BASAL CELL CARCINOMA, SUPERFICIAL AND NODULAR PATTERNS  1-3. BCC skin cancer- needs EDC x 3

## 2020-04-05 ENCOUNTER — Ambulatory Visit
Admission: RE | Admit: 2020-04-05 | Discharge: 2020-04-05 | Disposition: A | Discharge status: Home / Self Care | Payer: BC Managed Care – PPO | Source: Ambulatory Visit | Attending: Family Medicine | Admitting: Family Medicine

## 2020-04-05 ENCOUNTER — Other Ambulatory Visit: Payer: Self-pay

## 2020-04-05 DIAGNOSIS — N6459 Other signs and symptoms in breast: Secondary | ICD-10-CM

## 2020-04-05 DIAGNOSIS — Z1231 Encounter for screening mammogram for malignant neoplasm of breast: Secondary | ICD-10-CM | POA: Diagnosis present

## 2020-05-06 ENCOUNTER — Ambulatory Visit: Payer: BC Managed Care – PPO | Admitting: Dermatology

## 2020-07-09 ENCOUNTER — Ambulatory Visit: Payer: BC Managed Care – PPO | Admitting: Dermatology

## 2020-09-10 ENCOUNTER — Other Ambulatory Visit: Payer: Self-pay

## 2020-09-10 ENCOUNTER — Ambulatory Visit (INDEPENDENT_AMBULATORY_CARE_PROVIDER_SITE_OTHER): Payer: BC Managed Care – PPO | Admitting: Dermatology

## 2020-09-10 DIAGNOSIS — L578 Other skin changes due to chronic exposure to nonionizing radiation: Secondary | ICD-10-CM | POA: Diagnosis not present

## 2020-09-10 DIAGNOSIS — C44319 Basal cell carcinoma of skin of other parts of face: Secondary | ICD-10-CM

## 2020-09-10 DIAGNOSIS — C4441 Basal cell carcinoma of skin of scalp and neck: Secondary | ICD-10-CM | POA: Diagnosis not present

## 2020-09-10 MED ORDER — IMIQUIMOD 5 % EX CREA: TOPICAL_CREAM | Freq: Every day | CUTANEOUS | 1 refills | Status: DC

## 2020-09-10 NOTE — Progress Notes (Signed)
   Follow-Up Visit   Subjective  Holly Davenport is a 50 y.o. female who presents for the following: Basal Cell Carcinoma (Biopsy proven of left ant neck, left lat neck and left upper temple - treat today). Biopsies were done in January 22.  Areas have healed up well.    The following portions of the chart were reviewed this encounter and updated as appropriate:        Review of Systems:  No other skin or systemic complaints except as noted in HPI or Assessment and Plan.  Objective  Well appearing patient in no apparent distress; mood and affect are within normal limits.  A focused examination was performed including neck, face. Relevant physical exam findings are noted in the Assessment and Plan.  Left ant neck, left lat neck Well healed biopsy sites.       Left upper temple Well healed biopsy site      Assessment & Plan   Actinic Damage - chronic, secondary to cumulative UV radiation exposure/sun exposure over time - diffuse scaly erythematous macules with underlying dyspigmentation - Recommend daily broad spectrum sunscreen SPF 30+ to sun-exposed areas, reapply every 2 hours as needed.  - Recommend staying in the shade or wearing long sleeves, sun glasses (UVA+UVB protection) and wide brim hats (4-inch brim around the entire circumference of the hat). - Call for new or changing lesions.  Basal cell carcinoma (BCC) of skin of neck Left ant neck, left lat neck  imiquimod (ALDARA) 5 % cream Apply topically at bedtime. To left ant neck, left lat neck and left temple 5 nights per week  Start Imiquimod 5% cream qhs 5 times per week. Recheck on follow up.  Reviewed expected reaction when using imiquimod cream, including irritation and mild inflammation and risk of erosions or more severe inflammation. Reviewed not to apply this in an area larger than 4 x 4 inches to avoid flu-like symptoms. Only a thin layer is required. Reviewed if too much irritation occurs, ensure  application of only a thin layer and decrease frequency slightly to achieve a tolerable level of inflammation.    Basal cell carcinoma (BCC) of skin of other part of face Left upper temple  Start Imiquimod 5% cream qhs 5 times per week.  Reviewed expected reaction when using imiquimod cream, including irritation and mild inflammation and risk of erosions or more severe inflammation. Reviewed not to apply this in an area larger than 4 x 4 inches to avoid flu-like symptoms. Only a thin layer is required. Reviewed if too much irritation occurs, ensure application of only a thin layer and decrease frequency slightly to achieve a tolerable level of inflammation.    Return for 6-8 weeks BCC follow up.  I, Ashok Cordia, CMA, am acting as scribe for Brendolyn Patty, MD .  Documentation: I have reviewed the above documentation for accuracy and completeness, and I agree with the above.  Brendolyn Patty MD

## 2020-09-10 NOTE — Patient Instructions (Signed)
Reviewed expected reaction when using imiquimod cream, including irritation and mild inflammation and risk of erosions or more severe inflammation. Reviewed not to apply this in an area larger than 4 x 4 inches to avoid flu-like symptoms. Only a thin layer is required. Reviewed if too much irritation occurs, ensure application of only a thin layer and decrease frequency slightly to achieve a tolerable level of inflammation.     If you have any questions or concerns for your doctor, please call our main line at [phone] and press option 4 to reach your doctor's medical assistant. If no one answers, please leave a voicemail as directed and we will return your call as soon as possible. Messages left after 4 pm will be answered the following business day.   You may also send [place] a message via [place]. We typically respond to MyChart messages within 1-2 business days.  For prescription refills, please ask your pharmacy to contact our office. Our fax number is [phone].  If you have an urgent issue when the clinic is closed that cannot wait until the next business day, you can page your doctor at the number below.    Please note that while we do our best to be available for urgent issues outside of office hours, we are not available 24/7.   If you have an urgent issue and are unable to reach [place], you may choose to seek medical care at your doctor's office, retail clinic, urgent care center, or emergency room.  If you have a medical emergency, please immediately call 911 or go to the emergency department.  Pager Numbers  - Dr. Nehemiah Massed: [phone]  - Dr. Laurence Ferrari: [phone]  - Dr. Nicole Kindred: [phone]  In the event of inclement weather, please call our main line at [phone] for an update on the status of any delays or closures.  Dermatology Medication Tips: Please keep the boxes that topical medications come in in order to help keep track of the instructions about where and how to  use these. Pharmacies typically print the medication instructions only on the boxes and not directly on the medication tubes.   If your medication is too expensive, please contact our office at [phone] option 4 or send [place] a message through [place].   We are unable to tell what your co-pay for medications will be in advance as this is different depending on your insurance coverage. However, we may be able to find a substitute medication at lower cost or fill out paperwork to get insurance to cover a needed medication.   If a prior authorization is required to get your medication covered by your insurance company, please allow [place] 1-2 business days to complete this process.  Drug prices often vary depending on where the prescription is filled and some pharmacies may offer cheaper prices.  The website www.goodrx.com contains coupons for medications through different pharmacies. The prices here do not account for what the cost may be with help from insurance (it may be cheaper with your insurance), but the website can give you the price if you did not use any insurance.  - You can print the associated coupon and take it with your prescription to the pharmacy.  - You may also stop by our office during regular business hours and pick up a GoodRx coupon card.  - If you need your prescription sent electronically to a different pharmacy, notify our office through [facility] or by phone at [phone] option 4.

## 2020-11-05 ENCOUNTER — Ambulatory Visit: Payer: BC Managed Care – PPO | Admitting: Dermatology

## 2020-11-12 NOTE — Telephone Encounter (Signed)
ERRor

## 2020-12-02 ENCOUNTER — Other Ambulatory Visit: Payer: Self-pay | Admitting: Chiropractic Medicine

## 2020-12-02 ENCOUNTER — Other Ambulatory Visit (HOSPITAL_COMMUNITY): Payer: Self-pay | Admitting: Chiropractic Medicine

## 2020-12-02 DIAGNOSIS — M5441 Lumbago with sciatica, right side: Secondary | ICD-10-CM

## 2020-12-02 DIAGNOSIS — M9903 Segmental and somatic dysfunction of lumbar region: Secondary | ICD-10-CM

## 2020-12-17 ENCOUNTER — Ambulatory Visit: Admission: RE | Admit: 2020-12-17 | Payer: BC Managed Care – PPO | Source: Ambulatory Visit

## 2022-01-23 IMAGING — MG DIGITAL DIAGNOSTIC BILAT W/ TOMO W/ CAD
6 of 10 series · 6 of 30 positions shown · non-contrast
Comparison: Previous exam(s).

CLINICAL DATA: Right breast upper inner quadrant area of palpable
concern felt by the patient.

Clinically detected area of thickening between 2 and 10 o'clock in
the right breast.
EXAM:
DIGITAL DIAGNOSTIC BILATERAL MAMMOGRAM WITH CAD AND TOMOSYNTHESIS
ULTRASOUND BILATERAL BREAST
TECHNIQUE: Bilateral digital diagnostic mammography and breast tomosynthesis
was performed. Digital images of the breasts were evaluated with
computer-aided detection. Targeted ultrasound examination of the
Right breast was performed.

[R CC synth-2D]
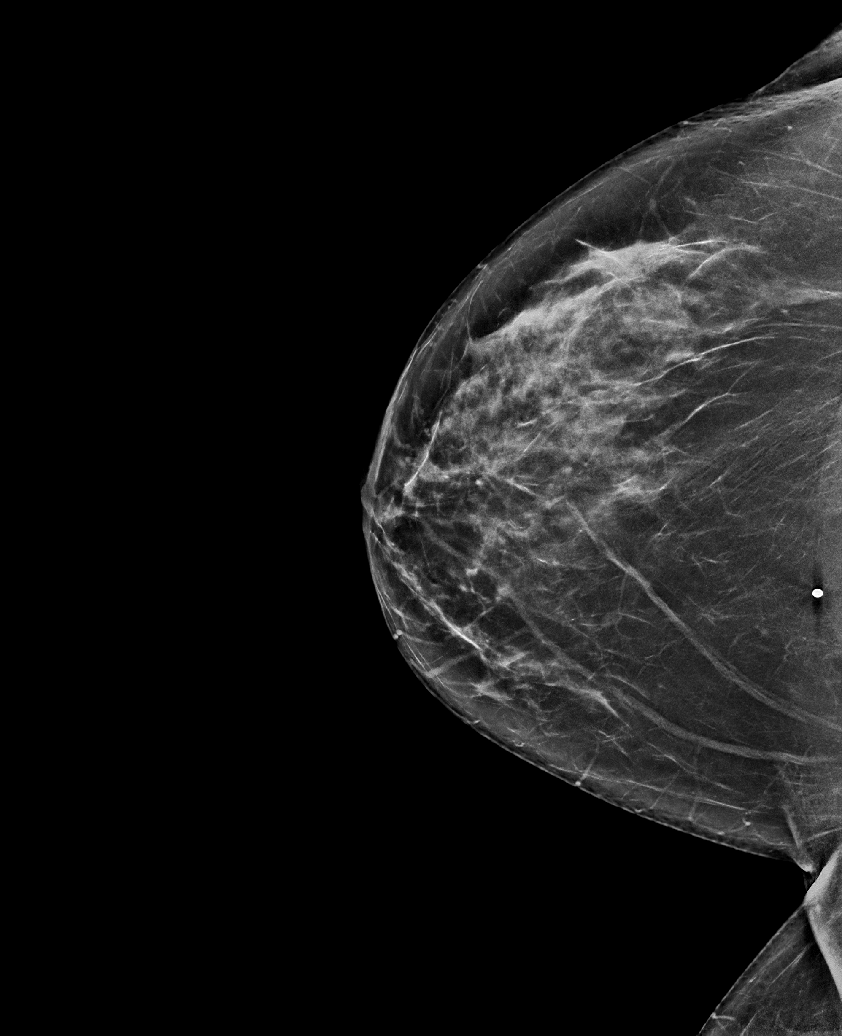

[L MLO synth-2D]
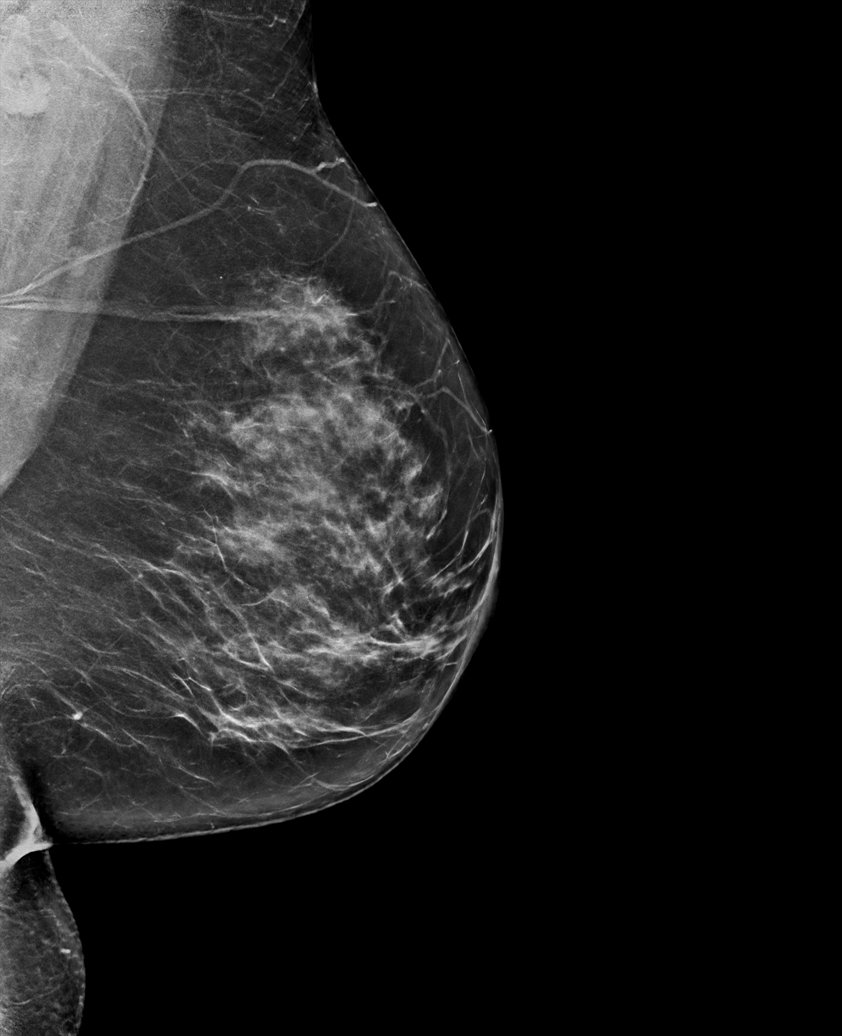

[R MLO synth-2D]
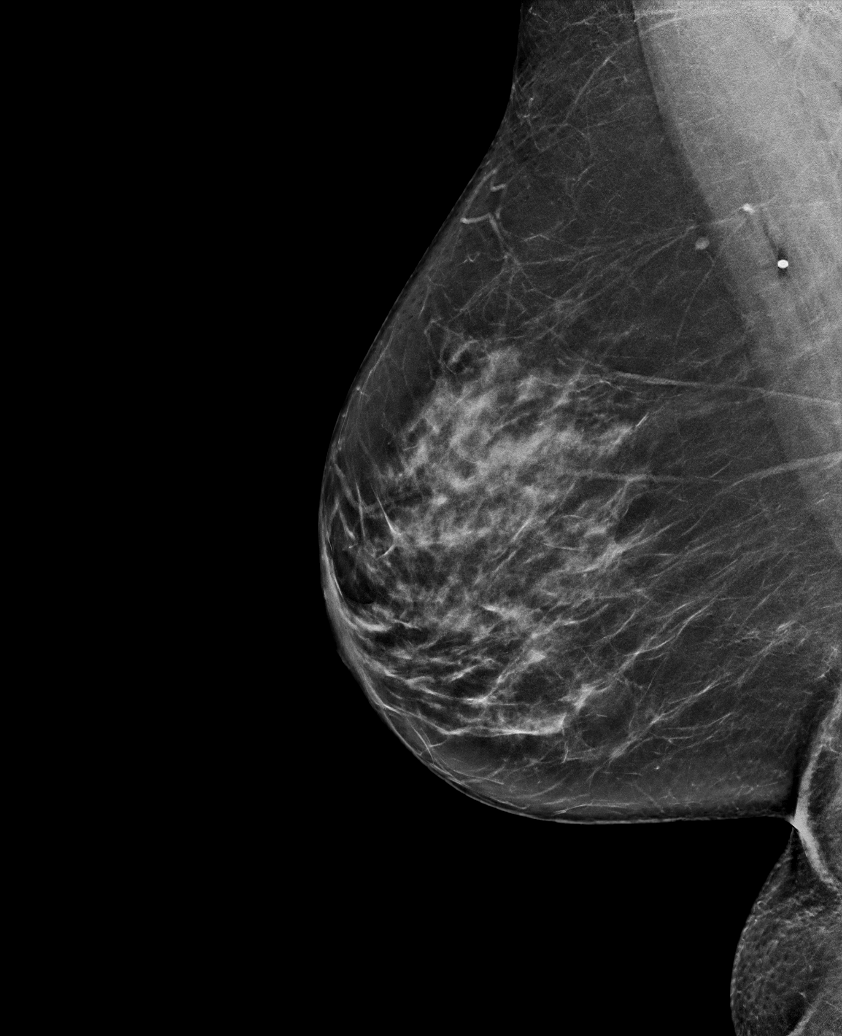

[R XCCM synth-2D]
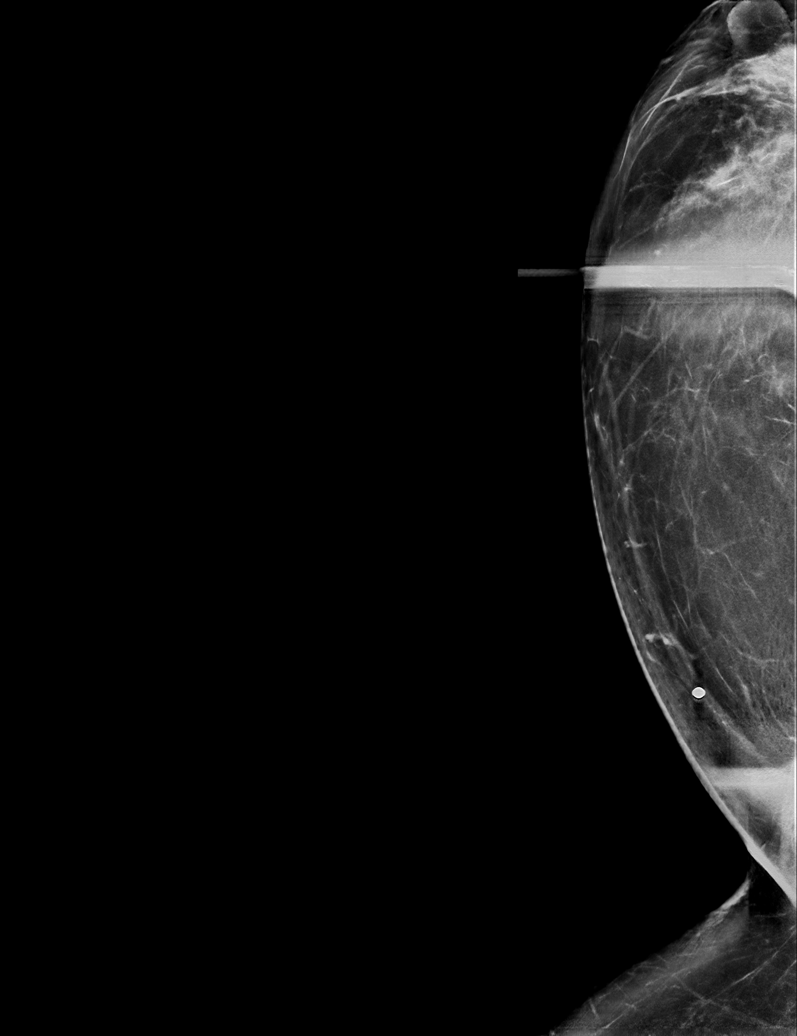

[L CC synth-2D]
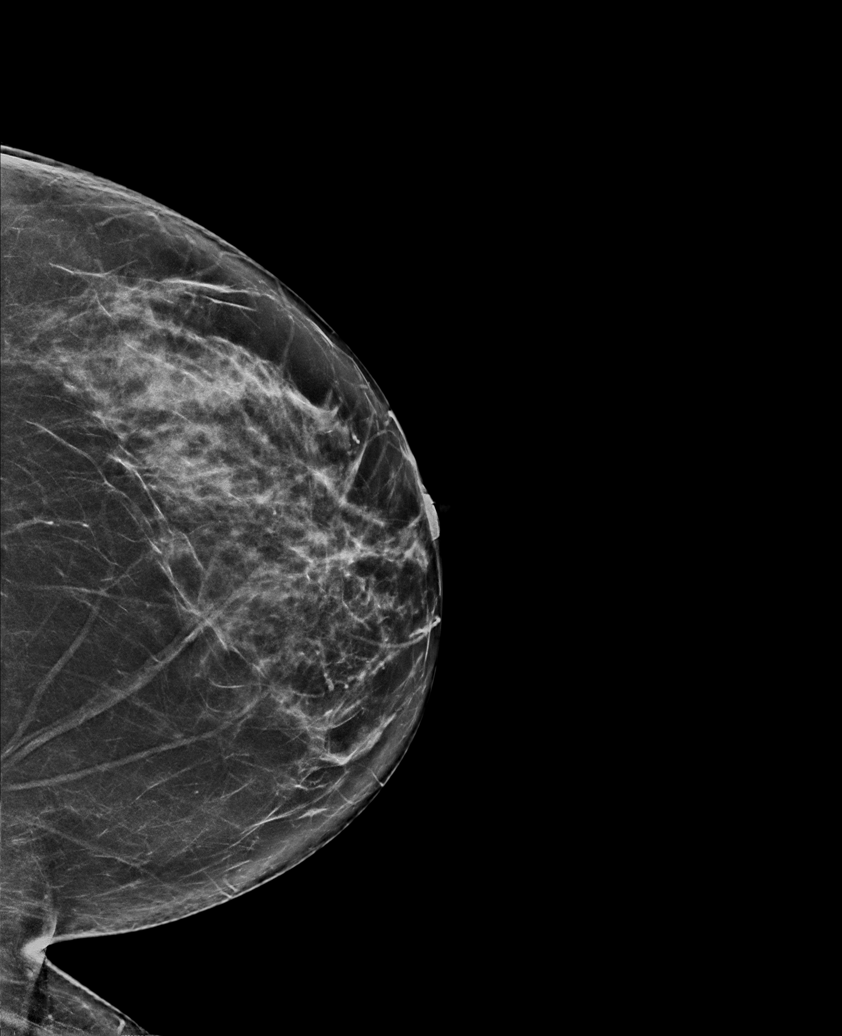

[R CC tomo · tomo slice 36/71.0]
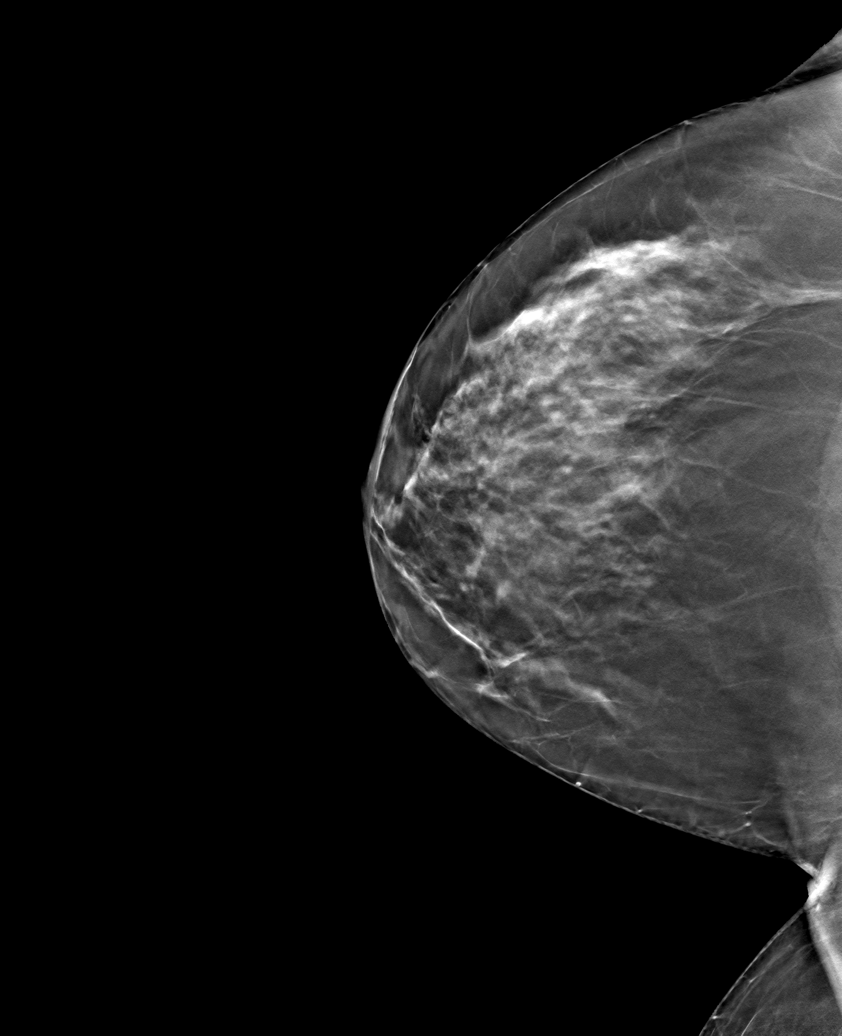

[6 of 30 positions shown; findings below may reference images not displayed]

ACR Breast Density Category c: The breast tissue is heterogeneously
dense, which may obscure small masses.
FINDINGS: Mammographically, there are no suspicious masses, areas of
architectural distortion or microcalcifications in either breast.

On physical exam, no suspicious masses are palpated in the upper
inner right breast.

Targeted right breast ultrasound is performed, showing no suspicious
masses or shadowing lesions at the site of palpable concern and
thickening.
IMPRESSION: No mammographic or sonographic evidence of breast malignancy.

RECOMMENDATION:
Further management of patient's right breast area of palpable
concern should be based on clinical grounds.

Screening mammogram in one year.(Code:YH-D-950)

I have discussed the findings and recommendations with the patient.
If applicable, a reminder letter will be sent to the patient
regarding the next appointment.

BI-RADS CATEGORY  1: Negative.

## 2022-06-02 ENCOUNTER — Other Ambulatory Visit: Payer: Self-pay | Admitting: Family Medicine

## 2022-06-02 DIAGNOSIS — Z1231 Encounter for screening mammogram for malignant neoplasm of breast: Secondary | ICD-10-CM

## 2022-06-23 ENCOUNTER — Ambulatory Visit
Admission: RE | Admit: 2022-06-23 | Discharge: 2022-06-23 | Disposition: A | Discharge status: Home / Self Care | Payer: BC Managed Care – PPO | Source: Ambulatory Visit | Attending: Family Medicine | Admitting: Family Medicine

## 2022-06-23 DIAGNOSIS — Z1231 Encounter for screening mammogram for malignant neoplasm of breast: Secondary | ICD-10-CM

## 2023-02-24 ENCOUNTER — Ambulatory Visit: Payer: BC Managed Care – PPO | Admitting: Dermatology

## 2023-02-24 ENCOUNTER — Encounter: Payer: Self-pay | Admitting: Dermatology

## 2023-02-24 DIAGNOSIS — C44622 Squamous cell carcinoma of skin of right upper limb, including shoulder: Secondary | ICD-10-CM

## 2023-02-24 DIAGNOSIS — L409 Psoriasis, unspecified: Secondary | ICD-10-CM | POA: Diagnosis not present

## 2023-02-24 DIAGNOSIS — W908XXA Exposure to other nonionizing radiation, initial encounter: Secondary | ICD-10-CM | POA: Diagnosis not present

## 2023-02-24 DIAGNOSIS — Z85828 Personal history of other malignant neoplasm of skin: Secondary | ICD-10-CM

## 2023-02-24 DIAGNOSIS — C4492 Squamous cell carcinoma of skin, unspecified: Secondary | ICD-10-CM

## 2023-02-24 DIAGNOSIS — L570 Actinic keratosis: Secondary | ICD-10-CM | POA: Diagnosis not present

## 2023-02-24 DIAGNOSIS — D492 Neoplasm of unspecified behavior of bone, soft tissue, and skin: Secondary | ICD-10-CM | POA: Diagnosis not present

## 2023-02-24 DIAGNOSIS — Z7189 Other specified counseling: Secondary | ICD-10-CM | POA: Diagnosis not present

## 2023-02-24 HISTORY — DX: Squamous cell carcinoma of skin, unspecified: C44.92

## 2023-02-24 MED ORDER — CLOBETASOL PROPIONATE 0.05 % EX SOLN: CUTANEOUS | 2 refills | Status: AC

## 2023-02-24 MED ORDER — TRIAMCINOLONE ACETONIDE 0.1 % EX CREA: TOPICAL_CREAM | CUTANEOUS | 2 refills | Status: DC

## 2023-02-24 NOTE — Progress Notes (Signed)
Follow-Up Visit   Subjective  Holly Davenport is a 52 y.o. female who presents for the following: Spots. Right forearm. Dur: 6-8 weeks. Red. Itches some. Painful if bumped. Used Imiquimod on area (had used for [facility] in the past) and it seemed to make it worse.   C/O raised, itchy scaly areas on scalp. Have been there a long time. Used OTC HC cream. Hard to apply through hair.  The patient has spots, moles and lesions to be evaluated, some may be new or changing and the patient may have concern these could be cancer.    The following portions of the chart were reviewed this encounter and updated as appropriate: medications, allergies, medical history  Review of Systems:  No other skin or systemic complaints except as noted in HPI or Assessment and Plan.  Objective  Well appearing patient in no apparent distress; mood and affect are within normal limits.  A focused examination was performed of the following areas: Scalp, arms  Relevant physical exam findings are noted in the Assessment and Plan.  Right Forearm - Posterior 1 cm pink keratotic plaque  Right Dorsal Forearm x1 Erythematous thin papules/macules with gritty scale.   Assessment & Plan   PSORIASIS Exam: Well-demarcated erythematous plaques at scalp  guttate pink scaly papules at forearms, back. No nail psoriasis. 5% BSA.  Chronic and persistent condition with duration or expected duration over one year. Condition is bothersome/symptomatic for patient. Currently flared.   patient states she has joint pain in hips  Psoriasis is a chronic non-curable, but treatable genetic/hereditary disease that may have other systemic features affecting other organ systems such as joints (Psoriatic Arthritis). It is associated with an increased risk of inflammatory bowel disease, heart disease, non-alcoholic fatty liver disease, and depression.  Treatments include light and laser treatments; topical medications; and systemic  medications including oral and injectables.  Treatment Plan: Start Clobetasol solution once or twice daily to scalp as needed for itching. Avoid applying to face, groin, and axilla. Use as directed. Long-term use can cause thinning of the skin.  Start Triamcinolone cream twice daily to arms and back until smooth. Avoid applying to face, groin, and axilla. Use as directed. Long-term use can cause thinning of the skin.  Topical steroids (such as triamcinolone, fluocinolone, fluocinonide, mometasone, clobetasol, halobetasol, betamethasone, hydrocortisone) can cause thinning and lightening of the skin if they are used for too long in the same area. Your physician has selected the right strength medicine for your problem and area affected on the body. Please use your medication only as directed by your physician to prevent side effects.   Discussed systemic treatment with Reviewed risks of biologics including immunosuppression, infections, injection site reaction, and failure to improve condition. Goal is control of skin condition, not cure.  Some older biologics such as Humira and Enbrel may slightly increase risk of malignancy and may worsen congestive heart failure.  Taltz and Cosentyx may cause inflammatory bowel disease to flare. The use of biologics requires long term medication management, including periodic office visits and monitoring of blood work.   NEOPLASM OF SKIN Right Forearm - Posterior Skin / nail biopsy Type of biopsy: tangential   Informed consent: discussed and consent obtained   Timeout: patient name, date of birth, surgical site, and procedure verified   Procedure prep:  Patient was prepped and draped in usual sterile fashion Prep type:  Isopropyl alcohol Anesthesia: the lesion was anesthetized in a standard fashion   Anesthetic:  1% lidocaine  w/ epinephrine 1-100,000 buffered w/ 8.4% NaHCO3 Instrument used: DermaBlade   Hemostasis achieved with: pressure and aluminum  chloride   Outcome: patient tolerated procedure well   Post-procedure details: sterile dressing applied and wound care instructions given   Dressing type: bandage and petrolatum   Specimen 1 - Surgical pathology Differential Diagnosis: R/O SCC  Check Margins: No Plan surgical excision here in office pending pathology results.  AK (ACTINIC KERATOSIS) Right Dorsal Forearm x1 Actinic keratoses are precancerous spots that appear secondary to cumulative UV radiation exposure/sun exposure over time. They are chronic with expected duration over 1 year. A portion of actinic keratoses will progress to squamous cell carcinoma of the skin. It is not possible to reliably predict which spots will progress to skin cancer and so treatment is recommended to prevent development of skin cancer.  Recommend daily broad spectrum sunscreen SPF 30+ to sun-exposed areas, reapply every 2 hours as needed.  Recommend staying in the shade or wearing long sleeves, sun glasses (UVA+UVB protection) and wide brim hats (4-inch brim around the entire circumference of the hat). Call for new or changing lesions. Destruction of lesion - Right Dorsal Forearm x1 Complexity: simple   Destruction method: cryotherapy   Informed consent: discussed and consent obtained   Timeout:  patient name, date of birth, surgical site, and procedure verified Lesion destroyed using liquid nitrogen: Yes   Region frozen until ice ball extended beyond lesion: Yes   Cryo cycles: 1 or 2. Outcome: patient tolerated procedure well with no complications   Post-procedure details: wound care instructions given   Additional details:  Prior to procedure, discussed risks of blister formation, small wound, skin dyspigmentation, or rare scar following cryotherapy. Recommend Vaseline ointment to treated areas while healing.    Return for Surgery as scheduled.  I, Lawson Radar, CMA, am acting as scribe for Elie Goody, MD.   Documentation: I have  reviewed the above documentation for accuracy and completeness, and I agree with the above.  Elie Goody, MD

## 2023-02-24 NOTE — Patient Instructions (Addendum)
Start Clobetasol solution once or twice daily to scalp as needed for itching. Avoid applying to face, groin, and axilla. Use as directed. Long-term use can cause thinning of the skin.  Start Triamcinolone cream twice daily to arms and back until smooth. Avoid applying to face, groin, and axilla. Use as directed. Long-term use can cause thinning of the skin.  Topical steroids (such as triamcinolone, fluocinolone, fluocinonide, mometasone, clobetasol, halobetasol, betamethasone, hydrocortisone) can cause thinning and lightening of the skin if they are used for too long in the same area. Your physician has selected the right strength medicine for your problem and area affected on the body. Please use your medication only as directed by your physician to prevent side effects.    Cryotherapy Aftercare  Wash gently with soap and water everyday.   Apply Vaseline Jelly daily until healed.      Wound Care Instructions  Cleanse wound gently with soap and water once a day then pat dry with clean gauze. Apply a thin coat of Petrolatum (petroleum jelly, "Vaseline") over the wound (unless you have an allergy to this). We recommend that you use a new, sterile tube of Vaseline. Do not pick or remove scabs. Do not remove the yellow or white "healing tissue" from the base of the wound.  Cover the wound with fresh, clean, nonstick gauze and secure with paper tape. You may use Band-Aids in place of gauze and tape if the wound is small enough, but would recommend trimming much of the tape off as there is often too much. Sometimes Band-Aids can irritate the skin.  You should call the office for your biopsy report after 1 week if you have not already been contacted.  If you experience any problems, such as abnormal amounts of bleeding, swelling, significant bruising, significant pain, or evidence of infection, please call the office immediately.  FOR ADULT SURGERY PATIENTS: If you need something for pain relief  you may take 1 extra strength Tylenol (acetaminophen) AND 2 Ibuprofen (200mg  each) together every 4 hours as needed for pain. (do not take these if you are allergic to them or if you have a reason you should not take them.) Typically, you may only need pain medication for 1 to 3 days.    Gentle Skin Care Guide  1. Bathe no more than once a day.  2. Avoid bathing in hot water  3. Use a mild soap like Dove, Vanicream, Cetaphil, CeraVe. Can use Lever 2000 or Cetaphil antibacterial soap  4. Use soap only where you need it. On most days, use it under your arms, between your legs, and on your feet. Let the water rinse other areas unless visibly dirty.  5. When you get out of the bath/shower, use a towel to gently blot your skin dry, don't rub it.  6. While your skin is still a little damp, apply a moisturizing cream such as Vanicream, CeraVe, Cetaphil, Eucerin, Sarna lotion or plain Vaseline Jelly. For hands apply Neutrogena [place] Hand Cream or Excipial Hand Cream.  7. Reapply moisturizer any time you start to itch or feel dry.  8. Sometimes using free and clear laundry detergents can be helpful. Fabric softener sheets should be avoided. Downy Free & Gentle liquid, or any liquid fabric softener that is free of dyes and perfumes, it acceptable to use  9. If your doctor has given you prescription creams you may apply moisturizers over them       Due to recent changes in healthcare laws, you  may see results of your pathology and/or laboratory studies on MyChart before the doctors have had a chance to review them. We understand that in some cases there may be results that are confusing or concerning to you. Please understand that not all results are received at the same time and often the doctors may need to interpret multiple results in order to provide you with the best plan of care or course of treatment. Therefore, we ask that you please give [place] 2 business days to thoroughly review all your  results before contacting the office for clarification. Should we see a critical lab result, you will be contacted sooner.   If You Need Anything After Your Visit  If you have any questions or concerns for your doctor, please call our main line at [phone] and press option 4 to reach your doctor's medical assistant. If no one answers, please leave a voicemail as directed and we will return your call as soon as possible. Messages left after 4 pm will be answered the following business day.   You may also send [place] a message via MyChart. We typically respond to MyChart messages within 1-2 business days.  For prescription refills, please ask your pharmacy to contact our office. Our fax number is [phone].  If you have an urgent issue when the clinic is closed that cannot wait until the next business day, you can page your doctor at the number below.    Please note that while we do our best to be available for urgent issues outside of office hours, we are not available 24/7.   If you have an urgent issue and are unable to reach [place], you may choose to seek medical care at your doctor's office, retail clinic, urgent care center, or emergency room.  If you have a medical emergency, please immediately call 911 or go to the emergency department.  Pager Numbers  - Dr. Gwen Pounds: [phone]  - Dr. Roseanne Reno: [phone]  - Dr. Katrinka Blazing: [phone]   In the event of inclement weather, please call our main line at [phone] for an update on the status of any delays or closures.  Dermatology Medication Tips: Please keep the boxes that topical medications come in in order to help keep track of the instructions about where and how to use these. Pharmacies typically print the medication instructions only on the boxes and not directly on the medication tubes.   If your medication is too expensive, please contact our office at [phone] option 4 or send [place] a message through MyChart.   We are  unable to tell what your co-pay for medications will be in advance as this is different depending on your insurance coverage. However, we may be able to find a substitute medication at lower cost or fill out paperwork to get insurance to cover a needed medication.   If a prior authorization is required to get your medication covered by your insurance company, please allow [place] 1-2 business days to complete this process.  Drug prices often vary depending on where the prescription is filled and some pharmacies may offer cheaper prices.  The website www.goodrx.com contains coupons for medications through different pharmacies. The prices here do not account for what the cost may be with help from insurance (it may be cheaper with your insurance), but the website can give you the price if you did not use any insurance.  - You can print the associated coupon and take it with your prescription to the pharmacy.  -  You may also stop by our office during regular business hours and pick up a GoodRx coupon card.  - If you need your prescription sent electronically to a different pharmacy, notify our office through [facility] or by phone at [phone] option 4.     Si Usted Necesita Algo Despus de Su Visita  Tambin puede enviarnos un mensaje a travs de Clinical cytogeneticist. Por lo general respondemos a los mensajes de MyChart en el transcurso de 1 a 2 das hbiles.  Para renovar recetas, por favor pida a su farmacia que se ponga en contacto con nuestra oficina. Annie Sable de fax es [place] [phone].  Si tiene un asunto urgente cuando la clnica est cerrada y que no puede esperar hasta el siguiente da hbil, puede llamar/localizar a su doctor(a) al nmero que aparece a continuacin.   Por favor, tenga en cuenta que aunque hacemos todo lo posible para estar disponibles para asuntos urgentes fuera del horario de [place], no estamos disponibles las 24 horas del da, los 7 [address]  [address] de la [place].   Si tiene un  problema urgente y no puede comunicarse con nosotros, puede optar por buscar atencin mdica  en el consultorio de su doctor(a), en una clnica privada, en un centro de atencin urgente o en una sala de emergencias.  Si tiene Engineer, drilling, por favor llame inmediatamente al 911 o vaya a la sala de emergencias.  Nmeros de bper  - Dr. Gwen Pounds: [phone]  - Dra. Roseanne Reno: [phone]  - Dr. Katrinka Blazing: [phone]   En caso de inclemencias del tiempo, por favor llame a Lacy Duverney principal al [phone] para una actualizacin sobre el [place] de cualquier retraso o cierre.  Consejos para la medicacin en dermatologa: Por favor, guarde las cajas en las que vienen los medicamentos de uso tpico para ayudarle a seguir las instrucciones sobre dnde y cmo usarlos. Las farmacias generalmente imprimen las instrucciones del medicamento slo en las cajas y no directamente en los tubos del [place].   Si su medicamento es muy caro, por favor, pngase en contacto con Rolm Gala llamando al [phone] y presione la opcin 4 o envenos un mensaje a travs de Clinical cytogeneticist.   No podemos decirle cul ser su copago por los medicamentos por adelantado ya que esto es diferente dependiendo de la cobertura de su seguro. Sin embargo, es posible que podamos encontrar un medicamento sustituto a Audiological scientist un formulario para que el seguro cubra el medicamento que se considera necesario.   Si se requiere una autorizacin previa para que su compaa de seguros [place] su medicamento, por favor permtanos de 1 a 2 das hbiles para completar [address]  Los precios de los medicamentos varan con frecuencia dependiendo del Environmental consultant de dnde se surte la receta y alguna farmacias pueden ofrecer precios ms baratos.  El sitio web www.goodrx.com tiene cupones para medicamentos de Health and safety inspector. Los precios aqu no tienen en cuenta lo que podra costar con la ayuda del seguro (puede ser ms  barato con su seguro), pero el sitio web puede darle el precio si no utiliz Tourist information centre manager.  - Puede imprimir el cupn correspondiente y llevarlo con su receta a la farmacia.  - Tambin puede pasar por nuestra oficina durante el horario de atencin regular y Education officer, museum una tarjeta de cupones de GoodRx.  - Si necesita que su receta se enve electrnicamente a Psychiatrist, informe a nuestra oficina a travs de MyChart de [place] o por telfono llamando al [phone]  y presione la opcin 4.

## 2023-02-27 ENCOUNTER — Encounter: Payer: Self-pay | Admitting: Dermatology

## 2023-03-01 LAB — SURGICAL PATHOLOGY

## 2023-03-08 ENCOUNTER — Ambulatory Visit: Payer: BC Managed Care – PPO | Admitting: Dermatology

## 2023-03-08 DIAGNOSIS — C44622 Squamous cell carcinoma of skin of right upper limb, including shoulder: Secondary | ICD-10-CM

## 2023-03-08 DIAGNOSIS — C4492 Squamous cell carcinoma of skin, unspecified: Secondary | ICD-10-CM

## 2023-03-08 MED ORDER — MUPIROCIN 2 % EX OINT: 1.0000 | TOPICAL_OINTMENT | Freq: Every day | CUTANEOUS | 0 refills | Status: DC

## 2023-03-08 NOTE — Patient Instructions (Signed)

## 2023-03-08 NOTE — Progress Notes (Signed)
   Follow-Up Visit   Subjective  Holly Davenport is a 52 y.o. female who presents for the following: Excision of bx proven SCC at right posterior forearm.  The following portions of the chart were reviewed this encounter and updated as appropriate: medications, allergies, medical history  Review of Systems:  No other skin or systemic complaints except as noted in HPI or Assessment and Plan.  Objective  Well appearing patient in no apparent distress; mood and affect are within normal limits.  A focused examination was performed of the following areas: Right arm Relevant physical exam findings are noted in the Assessment and Plan.   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 - Posterior Skin excision  Lesion length (cm):  1 Margin per side (cm):  0.4 Total excision diameter (cm):  1.8 Informed consent: discussed and consent obtained   Timeout: patient name, date of birth, surgical site, and procedure verified   Procedure prep:  Patient was prepped and draped in usual sterile fashion Prep type:  Chlorhexidine Anesthesia: the lesion was anesthetized in a standard fashion   Anesthetic:  1% lidocaine w/ epinephrine 1-100,000 buffered w/ 8.4% NaHCO3 (6 cc lidocaine, 2 cc bupivicaine) Instrument used: #15 blade   Hemostasis achieved with: pressure   Outcome: patient tolerated procedure well with no complications   Additional details:  Tagged lateral  Skin repair Complexity:  Intermediate Final length (cm):  6 Informed consent: discussed and consent obtained   Timeout: patient name, date of birth, surgical site, and procedure verified   Procedure prep:  Patient was prepped and draped in usual sterile fashion Prep type:  Chlorhexidine Anesthesia: the lesion was anesthetized in a standard fashion   Anesthetic:  1% lidocaine w/ epinephrine 1-100,000 buffered w/ 8.4% NaHCO3 Reason for type of repair: reduce tension  to allow closure, reduce the risk of dehiscence, infection, and necrosis, reduce subcutaneous dead space and avoid a hematoma, allow closure of the large defect and preserve normal anatomy   Undermining: edges could be approximated without difficulty   Subcutaneous layers (deep stitches):  Suture size:  4-0 Suture type: Monocryl (poliglecaprone 25)   Stitches:  Buried vertical mattress Fine/surface layer approximation (top stitches):  Suture size:  5-0 Suture type: Prolene (polypropylene)   Stitches: simple running   Suture removal (days):  7 Hemostasis achieved with: suture, pressure and electrodesiccation Outcome: patient tolerated procedure well with no complications   Post-procedure details: sterile dressing applied and wound care instructions given   Dressing type: petrolatum, bandage and pressure dressing   Specimen 1 - Surgical pathology Differential Diagnosis: BX proven SCC keratoacanthoma type  Check Margins: yes Healing bx site [id number] Tagged lateral   Return in about 1 week (around 03/15/2023) for Suture Removal.  Anise Salvo, RMA, am acting as scribe for Elie Goody, MD .   Documentation: I have reviewed the above documentation for accuracy and completeness, and I agree with the above.  Elie Goody, MD

## 2023-03-11 LAB — SURGICAL PATHOLOGY

## 2023-03-15 ENCOUNTER — Encounter: Payer: Self-pay | Admitting: Dermatology

## 2023-03-15 ENCOUNTER — Ambulatory Visit: Payer: BC Managed Care – PPO | Admitting: Dermatology

## 2023-03-15 DIAGNOSIS — Z4802 Encounter for removal of sutures: Secondary | ICD-10-CM

## 2023-03-15 DIAGNOSIS — L409 Psoriasis, unspecified: Secondary | ICD-10-CM

## 2023-03-15 DIAGNOSIS — Z5189 Encounter for other specified aftercare: Secondary | ICD-10-CM

## 2023-03-15 MED ORDER — CLOBETASOL PROPIONATE 0.05 % EX CREA: 1.0000 | TOPICAL_CREAM | Freq: Two times a day (BID) | CUTANEOUS | 0 refills | Status: DC

## 2023-03-15 NOTE — Patient Instructions (Signed)

## 2023-03-15 NOTE — Progress Notes (Signed)
   Follow-Up Visit   Subjective  Holly Davenport is a 53 y.o. female who presents for the following: Suture removal. Right dorsal forearm.   Pathology showed residual SCC, margins free.  The following portions of the chart were reviewed this encounter and updated as appropriate: medications, allergies, medical history  Review of Systems:  No other skin or systemic complaints except as noted in HPI or Assessment and Plan.  Objective  Well appearing patient in no apparent distress; mood and affect are within normal limits.  Areas Examined: Right arm, elbows  Relevant physical exam findings are noted in the Assessment and Plan.    Assessment & Plan   PSORIASIS   Related Medications clobetasol  cream (TEMOVATE ) 0.05 % Apply 1 Application topically 2 (two) times daily. Apply to psoriasis on forearms and back until skin is smooth, then stop VISIT FOR WOUND CHECK   ENCOUNTER FOR REMOVAL OF SUTURES   Encounter for Removal of Sutures - Incision site is clean, dry and intact. - Wound cleansed, sutures removed, wound cleansed and steri strips applied.  - Discussed pathology results showing residual SCC, margins free - Patient advised to Davenport steri-strips dry until they fall off. - Scars remodel for a full year. - Once steri-strips fall off, patient can apply over-the-counter silicone scar cream once to twice a day to help with scar remodeling if desired. - Patient advised to call with any concerns or if they notice any new or changing lesions.  Return in about 6 months (around 09/12/2023) for TBSE, HxSCC, HxBCCs.  I, Jill Parcell, CMA, am acting as scribe for Boneta Sharps, MD.   Documentation: I have reviewed the above documentation for accuracy and completeness, and I agree with the above.  Boneta Sharps, MD

## 2023-03-25 ENCOUNTER — Other Ambulatory Visit: Payer: Self-pay | Admitting: Dermatology

## 2023-03-25 DIAGNOSIS — L409 Psoriasis, unspecified: Secondary | ICD-10-CM

## 2023-07-12 ENCOUNTER — Ambulatory Visit (HOSPITAL_COMMUNITY)
Admission: EM | Admit: 2023-07-12 | Discharge: 2023-07-12 | Disposition: A | Discharge status: Home / Self Care | Attending: Family Medicine | Admitting: Family Medicine

## 2023-07-12 ENCOUNTER — Encounter (HOSPITAL_COMMUNITY): Payer: Self-pay

## 2023-07-12 DIAGNOSIS — R1013 Epigastric pain: Secondary | ICD-10-CM | POA: Diagnosis not present

## 2023-07-12 DIAGNOSIS — Z3202 Encounter for pregnancy test, result negative: Secondary | ICD-10-CM

## 2023-07-12 LAB — POCT URINALYSIS DIP (MANUAL ENTRY)
Bilirubin, UA: NEGATIVE
Glucose, UA: NEGATIVE mg/dL
Ketones, POC UA: NEGATIVE mg/dL
Leukocytes, UA: NEGATIVE
Nitrite, UA: NEGATIVE
Protein Ur, POC: NEGATIVE mg/dL
Spec Grav, UA: 1.015 (ref 1.010–1.025)
Urobilinogen, UA: 0.2 U/dL
pH, UA: 7.5 (ref 5.0–8.0)

## 2023-07-12 LAB — POCT URINE PREGNANCY: Preg Test, Ur: NEGATIVE

## 2023-07-12 MED ORDER — LIDOCAINE VISCOUS HCL 2 % MT SOLN
OROMUCOSAL | Status: AC
  Filled 2023-07-12: qty 15

## 2023-07-12 MED ORDER — PANTOPRAZOLE SODIUM 40 MG PO TBEC: 40.0000 mg | DELAYED_RELEASE_TABLET | Freq: Every day | ORAL | 0 refills | Status: DC

## 2023-07-12 MED ORDER — FAMOTIDINE 40 MG PO TABS: 40.0000 mg | ORAL_TABLET | Freq: Every day | ORAL | 0 refills | Status: AC

## 2023-07-12 MED ORDER — LIDOCAINE VISCOUS HCL 2 % MT SOLN
15.0000 mL | Freq: Once | OROMUCOSAL | Status: AC
  Administered 2023-07-12: 15 mL via OROMUCOSAL

## 2023-07-12 MED ORDER — ALUM & MAG HYDROXIDE-SIMETH 200-200-20 MG/5ML PO SUSP
30.0000 mL | Freq: Once | ORAL | Status: AC
  Administered 2023-07-12: 30 mL via ORAL

## 2023-07-12 MED ORDER — ALUM & MAG HYDROXIDE-SIMETH 200-200-20 MG/5ML PO SUSP
ORAL | Status: AC
  Filled 2023-07-12: qty 30

## 2023-07-12 NOTE — Discharge Instructions (Addendum)
 You were seen today for abdominal pain.  Your urine was normal today.  Your pain is most consistent with GERD like issues.  I have sent out protonix 40mg  daily for your symptoms (this is what was covered under insurance) and famotidine to take at bedtime.  Please follow up with your primary care provider as scheduled.  If you have worsening pain then go to the ER for further evaluation.

## 2023-07-12 NOTE — ED Triage Notes (Signed)
 Patient here today with c/o abd pain X 2 weeks. Worse while lying down. Patient states that it was worse last night. Patient states that she feels a lot of pressure. Last BM was this morning. Patient has been taking a probiotic for about 6 months and she stopped it when this all started.

## 2023-07-12 NOTE — ED Provider Notes (Signed)
 [facility]    CSN: [id number] Arrival date & time: 07/12/23  0802      History   Chief Complaint Chief Complaint  Patient presents with   Abdominal Pain    HPI Holly Davenport is a 53 y.o. female.    Abdominal Pain Associated symptoms: nausea   Associated symptoms: no constipation, no diarrhea and no vomiting    Patient is here for abdominal pain.  Over the last several months she has had issues with nausea, some diarrhea, with epigastric discomfort.  This would then go away.  At times it is sore to the touch.  Several weeks ago she had worsening pain, burning, and thought it was a flare of her acid reflux.  She started nexium, which has been someone helpful with several days of no pain/discomfort.  She felt well yesterday, but then felt worse last night.  It is usually worse when she lays down.  She had to sleep in the recliner last night.  She takes nexium, but not sure about the dosage.  She does think it is food related as well.  She had pizza over the weekend.  She does not drink ETOH;   takes advil prn during the week.   Normal Bms.  No constipation.    She does have mild pain at this time, and worse when laying back flat.       Past Medical History:  Diagnosis Date   Actinic keratosis 07/17/2018   R dorsum hand - biopsy proven    Basal cell carcinoma ? 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 No pertinent surgical history.  OB History   No obstetric history on file.      Home Medications    Prior to Admission medications   Medication Sig Start Date End Date Taking? Authorizing Provider  clobetasol  (TEMOVATE ) 0.05 % external solution Apply once or twice daily to scalp as needed for itching. Avoid applying to face, groin, and axilla 02/24/23   Harris Liming, MD  clobetasol  cream (TEMOVATE ) 0.05 % Apply 0.5 gm twice daily to psoriasis on forearms and back until skin is smooth then stop. Resume PRN flares. 03/25/23   Harris Liming, MD  mometasone  (ELOCON ) 0.1 % cream Apply to affected areas rash 1-2 times a day until improved. 03/26/20   Artemio Larry, MD  triamcinolone  cream (KENALOG ) 0.1 % Apply twice daily to arms and back until smooth. Avoid applying to face, groin, and axilla. 02/24/23   Harris Liming, MD  triamterene-hydrochlorothiazide (DYAZIDE) 37.5-25 MG per capsule Take 1 capsule by mouth daily.    [provider]    Family History Family History  Problem Relation Age of Onset   Stroke Mother    Diabetes Father    Heart disease  Father        Heart attack   Breast cancer Maternal Aunt     Social History Social History   Tobacco Use   Smoking status: Never   Smokeless tobacco: Never  Substance Use Topics   Alcohol use: No    Alcohol/week: 0.0 standard drinks of alcohol   Drug use: No     Allergies   Patient has no known allergies.   Review of Systems Review of Systems  Constitutional: Negative.   HENT: Negative.    Respiratory: Negative.    Cardiovascular: Negative.   Gastrointestinal:  Positive for abdominal pain and nausea. Negative for constipation, diarrhea and vomiting.  Genitourinary: Negative.   Musculoskeletal: Negative.   Psychiatric/Behavioral: Negative.       Physical Exam Triage Vital Signs ED Triage Vitals   Encounter Vitals Group     BP 07/12/23 0824 121/79     Systolic BP Percentile --      Diastolic BP Percentile --      Pulse Rate 07/12/23 0824 80     Resp 07/12/23 0824 16     Temp 07/12/23 0824 97.8 F (36.6 C)     Temp Source 07/12/23 0824 Oral     SpO2 07/12/23 0824 96 %     Weight 07/12/23 0824 215 lb (97.5 kg)     Height 07/12/23 0824 5\' 7"  (1.702 m)     Head Circumference --      Peak Flow --      Pain Score 07/12/23 0822 7     Pain Loc --      Pain Education --      Exclude from Growth Chart --    No data found.  Updated Vital Signs BP 121/79 (BP Location: Left Arm)   Pulse 80   Temp 97.8 F (36.6 C) (Oral)   Resp 16   Ht 5\' 7"  (1.702 m)   Wt 97.5 kg   LMP 11/19/2022 (Approximate) Comment: Not since having polyps removed from uterus  SpO2 96%   BMI 33.67 kg/m   Visual Acuity Right Eye Distance:   Left Eye Distance:   Bilateral Distance:    Right Eye Near:   Left Eye Near:    Bilateral Near:     Physical Exam Constitutional:      General: She is not in acute distress.    Appearance: She is well-developed and normal weight. She is not ill-appearing or toxic-appearing.  Cardiovascular:     Rate and Rhythm: Normal rate and regular rhythm.  Pulmonary:     Effort: Pulmonary effort is normal.     Breath sounds: Normal breath sounds.  Abdominal:     General: Bowel sounds are normal.     Palpations: Abdomen is soft.     Tenderness: There is abdominal tenderness in the epigastric area. There is no guarding or rebound.  Skin:    General: Skin is warm.  Neurological:     Mental Status: She is alert.      UC Treatments / Results  Labs (all labs ordered are listed, but only abnormal results are displayed) Labs Reviewed  POCT URINALYSIS DIP (MANUAL ENTRY) - Abnormal; Notable for the following components:      Result Value   Blood, UA trace-intact (*)    All other components within normal limits  POCT URINE PREGNANCY   UPT  negative  EKG   Radiology No results found.  Procedures Procedures (including critical care time)  Medications Ordered in UC  Medications  lidocaine (XYLOCAINE) 2 % viscous mouth solution 15 mL (15 mLs Mouth/Throat Given 07/12/23 0859)  alum & mag hydroxide-simeth (MAALOX/MYLANTA) 200-200-20 MG/5ML suspension 30 mL (30 mLs Oral Given 07/12/23 0859)    Initial Impression / Assessment and Plan / UC Course  I have reviewed the triage vital signs and the nursing notes.  Pertinent labs & imaging results that were available during my care of the patient were reviewed by me and considered in my medical decision making (see chart for details).   Final Clinical Impressions(s) / UC Diagnoses   Final diagnoses:  Epigastric pain     Discharge Instructions      You were seen today for abdominal pain.  Your urine was normal today.  Your pain is most consistent with GERD like issues.  I have sent out protonix 40mg  daily for your symptoms (this is what was covered under insurance) and famotidine to take at bedtime.  Please follow up with your primary care provider as scheduled.  If you have worsening pain then go to the ER for further evaluation.     ED Prescriptions     Medication Sig Dispense Auth. Provider   pantoprazole (PROTONIX) 40 MG tablet Take 1 tablet (40 mg total) by mouth daily. 30 tablet Caliah Kopke, MD   famotidine (PEPCID) 40 MG tablet Take 1 tablet (40 mg total) by mouth at bedtime. 30 tablet Lesle Ras, MD      PDMP not reviewed this encounter.   Lesle Ras, MD 07/12/23 [phone]

## 2023-08-17 ENCOUNTER — Other Ambulatory Visit: Payer: Self-pay | Admitting: Family

## 2023-08-17 ENCOUNTER — Encounter: Payer: Self-pay | Admitting: Internal Medicine

## 2023-08-17 DIAGNOSIS — Z1231 Encounter for screening mammogram for malignant neoplasm of breast: Secondary | ICD-10-CM

## 2023-09-30 ENCOUNTER — Ambulatory Visit

## 2023-10-07 ENCOUNTER — Encounter: Payer: Self-pay | Admitting: Internal Medicine

## 2023-10-07 ENCOUNTER — Ambulatory Visit (INDEPENDENT_AMBULATORY_CARE_PROVIDER_SITE_OTHER): Admitting: Internal Medicine

## 2023-10-07 VITALS — BP 110/70 | HR 80 | Ht 65.5 in | Wt 197.1 lb

## 2023-10-07 DIAGNOSIS — K5900 Constipation, unspecified: Secondary | ICD-10-CM | POA: Diagnosis not present

## 2023-10-07 DIAGNOSIS — K219 Gastro-esophageal reflux disease without esophagitis: Secondary | ICD-10-CM

## 2023-10-07 DIAGNOSIS — R1013 Epigastric pain: Secondary | ICD-10-CM | POA: Diagnosis not present

## 2023-10-07 DIAGNOSIS — R09A2 Foreign body sensation, throat: Secondary | ICD-10-CM

## 2023-10-07 DIAGNOSIS — R197 Diarrhea, unspecified: Secondary | ICD-10-CM

## 2023-10-07 DIAGNOSIS — Z1211 Encounter for screening for malignant neoplasm of colon: Secondary | ICD-10-CM

## 2023-10-07 NOTE — Patient Instructions (Addendum)
 Your provider has ordered Cologuard testing as an option for colon cancer screening. This is performed by [employer] and may be out of network with your insurance. PRIOR to completing the test, it is YOUR responsibility to contact your insurance about covered benefits for this test. Your out of pocket expense could be anywhere from $0.00 to $649.00.   When you call to check coverage with your insurer, please provide the following information:   -The ONLY provider of Cologuard is Opt[facility], dispensing  - CPT code for Cologuard is [phone].  [facility] NPI # [id number]  -[employer] Tax ID # [id number]   We have already sent your demographic and insurance information to [employer] (phone number [phone]) and they should contact you within the next week regarding your test. If you have not heard from them within the next week, please call our office at [phone].   You have been scheduled for an endoscopy. Please follow written instructions given to you at your visit today.  If you use inhalers (even only as needed), please bring them with you on the day of your procedure.  If you take any of the following medications, they will need to be adjusted prior to your procedure:   DO NOT TAKE 7 DAYS PRIOR TO TEST- Trulicity (dulaglutide) Ozempic, Wegovy (semaglutide) Mounjaro (tirzepatide) Bydureon Bcise (exanatide extended release)  DO NOT TAKE 1 DAY PRIOR TO YOUR TEST Rybelsus (semaglutide) Adlyxin (lixisenatide) Victoza (liraglutide) Byetta (exanatide)   Continue taking Pantoprazole   If your blood pressure at your visit was 140/90 or greater, please contact your primary care physician to follow up on this.  _______________________________________________________  If you are age 54 or older, your body mass index should be between 23-30. Your Body mass index is 32.3 kg/m. If this is out of the aforementioned range listed, please  consider follow up with your Primary Care Provider.  If you are age 54 or younger, your body mass index should be between 19-25. Your Body mass index is 32.3 kg/m. If this is out of the aformentioned range listed, please consider follow up with your Primary Care Provider.   ________________________________________________________  The [facility] providers would like to encourage you to use MYCHART to communicate with providers for non-urgent requests or questions.  Due to long hold times on the telephone, sending your provider a message by [facility] may be a faster and more efficient way to get a response.  Please allow 48 business hours for a response.  Please remember that this is for non-urgent requests.  _______________________________________________________  [facility] is using a team-based approach to care.  Your team is made up of your doctor and two to three APPS. Our APPS (Nurse Practitioners and Physician Assistants) work with your physician to ensure care continuity for you. They are fully qualified to address your health concerns and develop a treatment plan. They communicate directly with your gastroenterologist to care for you. Seeing the Advanced Practice Practitioners on your physician's team can help you by facilitating care more promptly, often allowing for earlier appointments, access to diagnostic testing, procedures, and other specialty referrals.   Due to recent changes in healthcare laws, you may see the results of your imaging and laboratory studies on MyChart before your provider has had a chance to review them.  We understand that in some cases there may be results that are confusing or concerning to you. Not all laboratory results come back in the same time frame and the provider  may be waiting for multiple results in order to interpret others.  Please give us  48 hours in order for your provider to thoroughly review all the results before contacting the office for  clarification of your results.   Thank you for entrusting me with your care and for choosing [facility], Dr. Estefana Kidney

## 2023-10-07 NOTE — Progress Notes (Signed)
 Chief Complaint: Epigastric ab pain and globus  HPI : 53 year old female with history of GERD, Meniere's disease, and psoriasis presents with epigastric ab pain and globus sensation   She has epigastric ab pain and globus sensation ever since 05/2023. The pain would radiate into her chest. She took a lot of Tums which seemed to help. She went to urgent care where they gave her Protonix , which was beneficial. She had taken Nexium PRN previously. The globus sensation comes and goes. Avoiding sugary foods and caffeine seems to help with this sensation. She stays away from fried foods as well. These dietary changes have helped with her pain and globus. Denies dysphagia. She is able to eat without issues. When she tried to come off of the Protonix , her symptoms recurred so she is now back on the Protonix . Denies any chest burning or regurgitation while taking Protonix , but she did before taking the Protonix . Denies history of seasonal allergies and asthma. Endorses rare nausea. Denies vomiting. She does have some psoriasis and thought that the gluten free diet may help. However, she tried a gluten free diet and it didn't seem to help with her symptoms. She alternates between constipation and diarrhea. BMs are typically more loose but now she can go a few days without having one. Denies blood in the stools. Denies prior EGD or colonoscopy. She has not been doing anything for colon cancer screening. She has been losing weight with about 23 lbs lost on the restricted diet that she is currently following. Uncle had esophageal cancer. Denies family history of colon cancer. She is taking Gut Restore probiotic, not clear if this is helping. She will use ibuprofen once a month. Endorses bloating.   Wt Readings from Last 3 Encounters:  10/07/23 197 lb 2 oz (89.4 kg)  07/12/23 215 lb (97.5 kg)  08/18/16 198 lb (89.8 kg)   Past Medical History:  Diagnosis Date   Actinic keratosis 07/17/2018   R dorsum hand - biopsy  proven    Basal cell carcinoma ? 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   right forearm posterior, keratoacanthoma type, excised 03/08/23   Uterine polyp    UTI (lower urinary tract infection)    Hx of UTI     Past Surgical History:  Procedure Laterality Date   DILATION AND CURETTAGE OF UTERUS     Family History  Problem Relation Age of Onset   Stroke Mother        26's   Cancer Mother        type unknown   Hypertension Mother    Diabetes Father    Heart disease Father        Heart attack   Bladder Cancer Father    Stroke Paternal Grandmother    Breast cancer Maternal Aunt    GER disease Son    ADD / ADHD Son    GER disease Son    Social History   Tobacco Use   Smoking status: Never   Smokeless tobacco: Never  Vaping Use   Vaping status: Never Used  Substance Use Topics   Alcohol  use: No    Alcohol/week: 0.0 standard drinks of alcohol   Drug use: No   Current Outpatient Medications  Medication Sig Dispense Refill   clobetasol  (TEMOVATE ) 0.05 % external solution Apply once or twice daily to scalp as needed for itching. Avoid applying to face, groin, and axilla 50 mL 2   famotidine  (PEPCID ) 40 MG tablet Take 1 tablet (40 mg total) by mouth at bedtime. 30 tablet 0   pantoprazole  (PROTONIX ) 40 MG tablet Take 1 tablet (40 mg total) by mouth daily. 30 tablet 0   triamterene-hydrochlorothiazide (DYAZIDE) 37.5-25 MG per capsule Take 1 capsule by mouth daily.     No current facility-administered medications for this visit.   No Known Allergies  Review of Systems: All systems reviewed and negative except where noted in HPI.   Physical Exam: BP 110/70  (BP Location: Left Arm, Patient Position: Sitting, Cuff Size: Normal)   Pulse 80   Ht 5' 5.5 (1.664 m) Comment: height measured without shoes  Wt 197 lb 2 oz (89.4 kg)   BMI 32.30 kg/m  Constitutional: Pleasant,well-developed, female in no acute distress. HEENT: Normocephalic and atraumatic. Conjunctivae are normal. No scleral icterus. Cardiovascular: Normal rate, regular rhythm.  Pulmonary/chest: Effort normal and breath sounds normal. No wheezing, rales or rhonchi. Abdominal: Soft, nondistended, nontender. Bowel sounds active throughout. There are no masses palpable. No hepatomegaly. Extremities: No edema Neurological: Alert and oriented to person place and time. Skin: Skin is warm and dry. No rashes noted. Psychiatric: Normal mood and affect. Behavior is normal.  ASSESSMENT AND PLAN: Epigastric ab pain Globus GERD Alternating constipation and diarrhea Colon cancer screening Patient presents with epigastric ab pain and globus sensation, which have improved significantly on daily PPI therapy. Attempts to come off of her PPI have prompted worsening of her symptoms. She has to follow a restricted diet with low sugar, low caffeine, and low fat to prevent recurrence of these symptoms. At this time, I suspect that the patient may have GERD or PUD as a source of her symptoms. Gallstones could be another source but would not explain her response to PPI therapy. I recommended doing an EGD for further evaluation to look for any signs of uncontrolled reflux, eosinophilic esophagitis, Barrett's esophagus, esophageal inlet patch, and/or gastritis/PUD. Patient is agreeable to this. For colon cancer screening, patient preferred to do the Cologuard test. - GERD handout - Continue Protonix  40 mg daily. - EGD [facility] - Cologuard  Estefana Kidney, MD  I spent 48 minutes of time, including in depth chart review, independent review of results as outlined above, communicating results with the patient directly,  face-to-face time with the patient, coordinating care, ordering studies and medications as appropriate, and documentation.

## 2023-10-08 ENCOUNTER — Telehealth: Payer: Self-pay | Admitting: Internal Medicine

## 2023-10-08 ENCOUNTER — Encounter: Payer: Self-pay | Admitting: Internal Medicine

## 2023-10-08 NOTE — Telephone Encounter (Signed)
 Spoke to patient went over instructions also sent updated instructions via mychart. Patient verbalized understanding

## 2023-10-08 NOTE — Telephone Encounter (Signed)
 Received call from patient wanting to advise she has changed her EGD date to 8/4, Please advise. Thank you

## 2023-10-11 ENCOUNTER — Ambulatory Visit (AMBULATORY_SURGERY_CENTER): Admitting: Internal Medicine

## 2023-10-11 ENCOUNTER — Encounter: Payer: Self-pay | Admitting: Internal Medicine

## 2023-10-11 VITALS — BP 115/73 | HR 60 | Temp 98.2°F | Resp 16 | Ht 65.5 in | Wt 197.0 lb

## 2023-10-11 DIAGNOSIS — K3189 Other diseases of stomach and duodenum: Secondary | ICD-10-CM

## 2023-10-11 DIAGNOSIS — K449 Diaphragmatic hernia without obstruction or gangrene: Secondary | ICD-10-CM | POA: Diagnosis not present

## 2023-10-11 DIAGNOSIS — R1013 Epigastric pain: Secondary | ICD-10-CM

## 2023-10-11 DIAGNOSIS — R09A2 Foreign body sensation, throat: Secondary | ICD-10-CM

## 2023-10-11 DIAGNOSIS — K219 Gastro-esophageal reflux disease without esophagitis: Secondary | ICD-10-CM

## 2023-10-11 MED ORDER — PANTOPRAZOLE SODIUM 40 MG PO TBEC: 40.0000 mg | DELAYED_RELEASE_TABLET | Freq: Two times a day (BID) | ORAL | 3 refills | Status: AC

## 2023-10-11 MED ORDER — SODIUM CHLORIDE 0.9 % IV SOLN: 500.0000 mL | Freq: Once | INTRAVENOUS | Status: DC

## 2023-10-11 NOTE — Progress Notes (Signed)
 Pt's states no medical or surgical changes since previsit or office visit.

## 2023-10-11 NOTE — Progress Notes (Signed)
 Called to room to assist during endoscopic procedure.  Patient ID and intended procedure confirmed with present staff. Received instructions for my participation in the procedure from the performing physician.

## 2023-10-11 NOTE — Progress Notes (Signed)
 Received verbal order for pantoprazole  40 mg to BID and follow up appointment was scheduled for 2-3 months per order.

## 2023-10-11 NOTE — Patient Instructions (Signed)
 Resume previous diet. Continue present medications. Awaiting pathology results. Handout on hiatal hernia.   YOU HAD AN ENDOSCOPIC PROCEDURE TODAY AT THE [facility]:   Refer to the procedure report that was given to you for any specific questions about what was found during the examination.  If the procedure report does not answer your questions, please call your gastroenterologist to clarify.  If you requested that your care partner not be given the details of your procedure findings, then the procedure report has been included in a sealed envelope for you to review at your convenience later.  YOU SHOULD EXPECT: Some feelings of bloating in the abdomen. Passage of more gas than usual.  Walking can help get rid of the air that was put into your GI tract during the procedure and reduce the bloating. If you had a lower endoscopy (such as a colonoscopy or flexible sigmoidoscopy) you may notice spotting of blood in your stool or on the toilet paper. If you underwent a bowel prep for your procedure, you may not have a normal bowel movement for a few days.  Please Note:  You might notice some irritation and congestion in your nose or some drainage.  This is from the oxygen used during your procedure.  There is no need for concern and it should clear up in a day or so.  SYMPTOMS TO REPORT IMMEDIATELY:  Following upper endoscopy (EGD)  Vomiting of blood or coffee ground material  New chest pain or pain under the shoulder blades  Painful or persistently difficult swallowing  New shortness of breath  Fever of 100F or higher  Black, tarry-looking stools  For urgent or emergent issues, a gastroenterologist can be reached at any hour by calling [phone]. Do not use MyChart messaging for urgent concerns.    DIET:  We do recommend a small meal at first, but then you may proceed to your regular diet.  Drink plenty of fluids but you should avoid alcoholic beverages for 24  hours.  ACTIVITY:  You should plan to take it easy for the rest of today and you should NOT DRIVE or use heavy machinery until tomorrow (because of the sedation medicines used during the test).    FOLLOW UP: Our staff will call the number listed on your records the next business day following your procedure.  We will call around 7:15- 8:00 am to check on you and address any questions or concerns that you may have regarding the information given to you following your procedure. If we do not reach you, we will leave a message.     If any biopsies were taken you will be contacted by phone or by letter within the next 1-3 weeks.  Please call us  at [phone] if you have not heard about the biopsies in 3 weeks.    SIGNATURES/CONFIDENTIALITY: You and/or your care partner have signed paperwork which will be entered into your electronic medical record.  These signatures attest to the fact that that the information above on your After Visit Summary has been reviewed and is understood.  Full responsibility of the confidentiality of this discharge information lies with you and/or your care-partner.

## 2023-10-11 NOTE — Progress Notes (Signed)
 Report given to PACU, vss

## 2023-10-11 NOTE — Progress Notes (Signed)
1504 Robinul 0.1 mg IV given due large amount of secretions upon assessment.  MD made aware, vss  

## 2023-10-11 NOTE — Op Note (Addendum)
 [facility] Patient Name: Holly Davenport Procedure Date: 10/11/2023 2:57 PM MRN: [id number] Endoscopist: Rosario Estefana Kidney , , [id number] Age: 53 Referring MD:  Date of Birth: 01/02/1971 Gender: Female Account #: [id number] Procedure:                Upper GI endoscopy Indications:              Epigastric abdominal pain, Heartburn, Globus                            sensation Medicines:                Monitored Anesthesia Care Procedure:                Pre-Anesthesia Assessment:                           - Prior to the procedure, a History and Physical                            was performed, and patient medications and                            allergies were reviewed. The patient's tolerance of                            previous anesthesia was also reviewed. The risks                            and benefits of the procedure and the sedation                            options and risks were discussed with the patient.                            All questions were answered, and informed consent                            was obtained. Prior Anticoagulants: The patient has                            taken no anticoagulant or antiplatelet agents. ASA                            Grade Assessment: II - A patient with mild systemic                            disease. After reviewing the risks and benefits,                            the patient was deemed in satisfactory condition to                            undergo the procedure.  After obtaining informed consent, the endoscope was                            passed under direct vision. Throughout the                            procedure, the patient's blood pressure, pulse, and                            oxygen saturations were monitored continuously. The                            GIF [id number] #[id number] was introduced through the                            mouth, and advanced to the second part of  duodenum.                            The upper GI endoscopy was accomplished without                            difficulty. The patient tolerated the procedure                            well. Scope In: Scope Out: Findings:                 The examined esophagus was normal. Biopsies were                            taken with a cold forceps for histology.                           A small hiatal hernia was present.                           Localized mildly erythematous mucosa without                            bleeding was found in the gastric antrum. Biopsies                            were taken with a cold forceps for histology.                           The examined duodenum was normal. Complications:            No immediate complications. Estimated Blood Loss:     Estimated blood loss was minimal. Impression:               - Normal esophagus. Biopsied.                           - Small hiatal hernia.                           -  Erythematous mucosa in the antrum. Biopsied.                           - Normal examined duodenum. Recommendation:           - Discharge patient to home (with escort).                           - Await pathology results.                           - Increase omeprazole from 40 mg daily to 40 mg                            twice daily to see if this helps with her globus                            sensation.                           - Return to GI clinic in 2-3 months.                           - The findings and recommendations were discussed                            with the patient. Dr Estefana Federico Rosario Estefana Federico,  10/11/2023 3:32:43 PM

## 2023-10-11 NOTE — Progress Notes (Signed)
 GASTROENTEROLOGY PROCEDURE H&P NOTE   Primary Care Physician: Derick Leita POUR, MD    Reason for Procedure:   Epigastric ab pain, globus, GERD  Plan:    EGD  Patient is appropriate for endoscopic procedure(s) in the ambulatory ([facility]) setting.  The nature of the procedure, as well as the risks, benefits, and alternatives were carefully and thoroughly reviewed with the patient. Ample time for discussion and questions allowed. The patient understood, was satisfied, and agreed to proceed.     HPI: Holly Davenport is a 53 y.o. female who presents for EGD for evaluation of epigastric ab pain, globus, GERD .  Patient was most recently seen in the Gastroenterology Clinic on 10/07/23.  No interval change in medical history since that appointment. Please refer to that note for full details regarding GI history and clinical presentation.   Past Medical History:  Diagnosis Date   Actinic keratosis 07/17/2018   R dorsum hand - biopsy proven    Basal cell carcinoma ? 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 Authorizing Provider  clobetasol  (TEMOVATE ) 0.05 % external solution Apply once or twice daily to scalp as needed for itching. Avoid applying to face, groin, and axilla 02/24/23   Claudene Lehmann, MD  famotidine  (PEPCID ) 40 MG tablet Take 1 tablet (40 mg total) by mouth  at bedtime. 07/12/23   Piontek, Rocky, MD  pantoprazole  (PROTONIX ) 40 MG tablet Take 1 tablet (40 mg total) by mouth daily. 07/12/23   Piontek, Rocky, MD  triamterene-hydrochlorothiazide (DYAZIDE) 37.5-25 MG per capsule Take 1 capsule by mouth daily.    [provider]    Current Outpatient Medications  Medication Sig Dispense Refill   clobetasol  (TEMOVATE ) 0.05 % external solution Apply once or twice daily to scalp as needed for itching. Avoid applying to face, groin, and axilla 50 mL 2   famotidine  (PEPCID ) 40 MG tablet Take 1 tablet (40 mg total) by mouth at bedtime. 30 tablet 0   pantoprazole  (PROTONIX ) 40 MG tablet Take 1 tablet (40 mg total) by mouth daily. 30 tablet 0   triamterene-hydrochlorothiazide (DYAZIDE) 37.5-25 MG per capsule Take 1 capsule by mouth daily.     Current Facility-Administered Medications  Medication Dose Route Frequency Provider Last Rate Last Admin   0.9 %  sodium chloride  infusion  500 mL Intravenous Once Cabela Pacifico C, MD        Allergies as of 10/11/2023   (No Known Allergies)    Family History  Problem Relation Age of Onset   Stroke Mother        32's   Cancer Mother        type unknown   Hypertension Mother    Diabetes Father    Heart disease Father  Heart attack   Bladder Cancer Father    Stroke Paternal Grandmother    Breast cancer Maternal Aunt    GER disease Son    ADD / ADHD Son    GER disease Son     Social History   Socioeconomic History   Marital status: Married    Spouse name: Not on file   Number of children: 2   Years of education: Not on file   Highest education level: Not on file  Occupational History   Not on file  Tobacco Use   Smoking status: Never   Smokeless tobacco: Never  Vaping Use   Vaping status: Never Used  Substance and Sexual Activity   Alcohol use: No    Alcohol/week: 0.0 standard drinks of alcohol   Drug use: No   Sexual activity: Yes    Partners: Male  Other Topics Concern   Not on  file  Social History Narrative   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   Lives at home with husband and 2 sons 57 and 75 yo   Enjoys watching sons play sports- Soccer, basketball, baseball   College   2 dogs and a cat live inside   Social Drivers of Corporate investment banker Strain: Not on file  Food Insecurity: Not on file  Transportation Needs: Not on file  Physical Activity: Not on file  Stress: Not on file  Social Connections: Not on file  Intimate Partner Violence: Not on file    Physical Exam: Vital signs in last 24 hours: BP (!) 140/92   Pulse 71   Temp 98.2 F (36.8 C)   Ht 5' 5.5 (1.664 m)   Wt 197 lb (89.4 kg)   SpO2 96%   BMI 32.28 kg/m  GEN: NAD EYE: Sclerae anicteric ENT: MMM CV: Non-tachycardic Pulm: No increased WOB GI: Soft NEURO:  Alert & Oriented   Estefana Kidney, MD [facility]   10/11/2023 2:19 PM

## 2023-10-12 ENCOUNTER — Telehealth: Payer: Self-pay | Admitting: *Deleted

## 2023-10-12 NOTE — Telephone Encounter (Signed)
  Follow up Call-     10/11/2023    2:13 PM  Call back number  Post procedure Call Back phone  # [phone]  Permission to leave phone message Yes     Patient questions:  Do you have a fever, pain , or abdominal swelling? No. Pain Score  0 *  Have you tolerated food without any problems? Yes.    Have you been able to return to your normal activities? Yes.    Do you have any questions about your discharge instructions: Diet   No. Medications  No. Follow up visit  No.  Do you have questions or concerns about your Care? No.  Actions: * If pain score is 4 or above: No action needed, pain <4.

## 2023-10-13 ENCOUNTER — Ambulatory Visit: Payer: Self-pay | Admitting: Internal Medicine

## 2023-10-13 LAB — SURGICAL PATHOLOGY

## 2023-10-28 ENCOUNTER — Ambulatory Visit
Admission: RE | Admit: 2023-10-28 | Discharge: 2023-10-28 | Disposition: A | Discharge status: Home / Self Care | Source: Ambulatory Visit | Attending: Family | Admitting: Family

## 2023-10-28 DIAGNOSIS — Z1231 Encounter for screening mammogram for malignant neoplasm of breast: Secondary | ICD-10-CM | POA: Insufficient documentation

## 2023-11-12 ENCOUNTER — Encounter: Admitting: Internal Medicine

## 2023-12-19 ENCOUNTER — Encounter: Payer: Self-pay | Admitting: Internal Medicine

## 2023-12-20 ENCOUNTER — Ambulatory Visit: Admitting: Gastroenterology
